# Patient Record
Sex: Female | Born: 1970 | Race: White | Hispanic: No | Marital: Married | Smoking: Never smoker
Health system: Southern US, Community
[De-identification: ages and names within clinical notes are randomized; demographics above are authoritative.]

## PROBLEM LIST (undated history)

## (undated) DIAGNOSIS — R51 Headache: Secondary | ICD-10-CM

## (undated) DIAGNOSIS — R55 Syncope and collapse: Secondary | ICD-10-CM

## (undated) DIAGNOSIS — N390 Urinary tract infection, site not specified: Secondary | ICD-10-CM

## (undated) DIAGNOSIS — T7840XA Allergy, unspecified, initial encounter: Secondary | ICD-10-CM

## (undated) HISTORY — DX: Headache: R51

## (undated) HISTORY — DX: Allergy, unspecified, initial encounter: T78.40XA

## (undated) HISTORY — DX: Urinary tract infection, site not specified: N39.0

## (undated) HISTORY — DX: Syncope and collapse: R55

---

## 2006-03-06 ENCOUNTER — Ambulatory Visit: Payer: Self-pay | Admitting: Gynecology

## 2006-04-06 ENCOUNTER — Ambulatory Visit: Payer: Self-pay | Admitting: Obstetrics & Gynecology

## 2007-03-22 ENCOUNTER — Observation Stay: Payer: Self-pay | Admitting: Unknown Physician Specialty

## 2007-03-23 ENCOUNTER — Ambulatory Visit: Payer: Self-pay | Admitting: Obstetrics & Gynecology

## 2007-03-29 ENCOUNTER — Observation Stay: Payer: Self-pay | Admitting: Unknown Physician Specialty

## 2007-04-09 ENCOUNTER — Ambulatory Visit: Payer: Self-pay | Admitting: Unknown Physician Specialty

## 2007-04-25 ENCOUNTER — Inpatient Hospital Stay: Payer: Self-pay

## 2008-09-29 ENCOUNTER — Ambulatory Visit: Payer: Self-pay

## 2012-09-24 ENCOUNTER — Ambulatory Visit: Payer: Self-pay | Admitting: Internal Medicine

## 2012-11-12 ENCOUNTER — Encounter: Payer: Self-pay | Admitting: Internal Medicine

## 2012-11-12 ENCOUNTER — Telehealth: Payer: Self-pay | Admitting: Internal Medicine

## 2012-11-12 ENCOUNTER — Other Ambulatory Visit (HOSPITAL_COMMUNITY)
Admission: RE | Admit: 2012-11-12 | Discharge: 2012-11-12 | Disposition: A | Discharge status: Home / Self Care | Payer: Managed Care, Other (non HMO) | Source: Ambulatory Visit | Attending: Internal Medicine | Admitting: Internal Medicine

## 2012-11-12 ENCOUNTER — Ambulatory Visit (INDEPENDENT_AMBULATORY_CARE_PROVIDER_SITE_OTHER): Payer: Managed Care, Other (non HMO) | Admitting: Internal Medicine

## 2012-11-12 VITALS — BP 120/78 | HR 80 | Temp 97.7°F | Resp 18 | Ht 63.0 in | Wt 142.2 lb

## 2012-11-12 DIAGNOSIS — Z1151 Encounter for screening for human papillomavirus (HPV): Secondary | ICD-10-CM | POA: Insufficient documentation

## 2012-11-12 DIAGNOSIS — Z124 Encounter for screening for malignant neoplasm of cervix: Secondary | ICD-10-CM

## 2012-11-12 DIAGNOSIS — N390 Urinary tract infection, site not specified: Secondary | ICD-10-CM

## 2012-11-12 DIAGNOSIS — Z1239 Encounter for other screening for malignant neoplasm of breast: Secondary | ICD-10-CM

## 2012-11-12 DIAGNOSIS — Z13220 Encounter for screening for lipoid disorders: Secondary | ICD-10-CM

## 2012-11-12 DIAGNOSIS — Z01419 Encounter for gynecological examination (general) (routine) without abnormal findings: Secondary | ICD-10-CM | POA: Insufficient documentation

## 2012-11-12 DIAGNOSIS — R51 Headache: Secondary | ICD-10-CM

## 2012-11-12 LAB — CBC WITH DIFFERENTIAL/PLATELET
Basophils Relative: 0.9 % (ref 0.0–3.0)
Eosinophils Absolute: 0.1 10*3/uL (ref 0.0–0.7)
Eosinophils Relative: 1.9 % (ref 0.0–5.0)
HCT: 36 % (ref 36.0–46.0)
Lymphocytes Relative: 33.5 % (ref 12.0–46.0)
Lymphs Abs: 1.8 10*3/uL (ref 0.7–4.0)
Monocytes Relative: 11.7 % (ref 3.0–12.0)
Neutro Abs: 2.7 10*3/uL (ref 1.4–7.7)
Neutrophils Relative %: 52 % (ref 43.0–77.0)
RBC: 4.46 Mil/uL (ref 3.87–5.11)
WBC: 5.3 10*3/uL (ref 4.5–10.5)

## 2012-11-12 LAB — COMPREHENSIVE METABOLIC PANEL
AST: 20 U/L (ref 0–37)
Albumin: 4 g/dL (ref 3.5–5.2)
BUN: 13 mg/dL (ref 6–23)
Calcium: 8.9 mg/dL (ref 8.4–10.5)
Chloride: 105 mEq/L (ref 96–112)
Glucose, Bld: 76 mg/dL (ref 70–99)
Potassium: 4 mEq/L (ref 3.5–5.1)

## 2012-11-12 LAB — LIPID PANEL
Cholesterol: 137 mg/dL (ref 0–200)
LDL Cholesterol: 82 mg/dL (ref 0–99)

## 2012-11-12 NOTE — Progress Notes (Signed)
  Subjective:    [name], female    DOB: [birth year], 42 y.o.   MRN: [id number]  HPI 42 year old female with past history of headaches who comes in today to establish care and for a complete physical exam.  She states that after her period, she experiences bad headaches.  Had no headaches during her pregnancy.  Headache may last 3-5 days.  Since she turned 41, her headaches appear to be lasting longer (7-10 days).  She is seeing OB.  Tried continuous birth control pills.   Made no change in headaches.  Off now.  Periods are regular.  Occur every 28 days.  Was evaluated at [facility].  Saw Dr Neale Burly.  Was started on topamax.  Took for one day and did not tolerate.  Has a history of reoccurring UTIs.  Has seen urology (Dr Lonna Cobb).  Had extensive w/up including cystoscopy and CT.  Was on daily suppressive abx for a while.  Not on abx now.     Past Medical History  Diagnosis Date  . Fainting spell   . Headache     Outpatient Encounter Prescriptions as of 11/12/2012  Medication Sig Dispense Refill  . Magnesium 400 MG CAPS Take 400 mg by mouth daily.      . Riboflavin (B-2 PO) Take 200 mg by mouth daily.       No facility-administered encounter medications on file as of 11/12/2012.    Review of Systems Patient denies any lightheadedness or dizziness.  Headaches as outlined.  No chest pain, tightness or palpitations.  No increased shortness of breath, cough or congestion.  No nausea or vomiting.  No acid reflux.  No abdominal pain or cramping.  No bowel change, such as diarrhea, constipation, BRBPR or melana.  No urine change now.  Has a history of reoccurring uti's.  Had extensive w/up.  No vaginal problems.  LMP 10/18/12.  Has been told her iron was low in the past.      Objective:   Physical Exam Filed Vitals:   11/12/12 0830  BP: 120/78  Pulse: 80  Temp: 97.7 F (36.5 C)  Resp: 61   42 year old female in no acute distress.   HEENT:  Nares- clear.   Oropharynx - without lesions. NECK:  Supple.  Nontender.  No audible bruit.  HEART:  Appears to be regular. LUNGS:  No crackles or wheezing audible.  Respirations even and unlabored.  RADIAL PULSE:  Equal bilaterally.    BREASTS:  No nipple discharge or nipple retraction present.  Could not appreciate any distinct nodules or axillary adenopathy.  ABDOMEN:  Soft, nontender.  Bowel sounds present and normal.  No audible abdominal bruit.  GU:  Normal external genitalia.  Vaginal vault without lesions.  Cervix identified.  Pap performed. Could not appreciate any adnexal masses or tenderness.   RECTAL:  Not performed.  EXTREMITIES:  No increased edema present.  DP pulses palpable and equal bilaterally.          Assessment & Plan:  POST PARTUM DEPRESSION.  Had post partum depression.  Doing well now.  Follow.   HISTORY OF LOW IRON.  Pt reports having a history of low iron.  Check cbc.   HEALTH MAINTENANCE.  Physical today.  Schedule mammogram.  Check routine labs including cholesterol.  Obtain outside records for review.

## 2012-11-12 NOTE — Telephone Encounter (Signed)
Pt giving info about medication:    Topamax   Week 1:  25 mg   Week 2:  50 mg   Week 3:  75 mg  at bedtime. This dose will probably be adjusted to 100 to 200 mg per day.

## 2012-11-12 NOTE — Telephone Encounter (Signed)
Pt notified of labs via my chart.  Will add iron studies.  Also addressed topamax phone message.

## 2012-11-12 NOTE — Telephone Encounter (Signed)
Please Advise... Patient was seen today 04/14

## 2012-11-13 ENCOUNTER — Ambulatory Visit: Payer: Managed Care, Other (non HMO)

## 2012-11-13 DIAGNOSIS — D649 Anemia, unspecified: Secondary | ICD-10-CM

## 2012-11-13 LAB — IBC PANEL
Saturation Ratios: 26.5 % (ref 20.0–50.0)
Transferrin: 231.8 mg/dL (ref 212.0–360.0)

## 2012-11-13 LAB — FERRITIN: Ferritin: 19.4 ng/mL (ref 10.0–291.0)

## 2012-11-13 NOTE — Telephone Encounter (Signed)
Pt to notify me - next step desires.

## 2012-11-14 ENCOUNTER — Telehealth: Payer: Self-pay | Admitting: Internal Medicine

## 2012-11-14 NOTE — Telephone Encounter (Signed)
Responded to My Chart message  - regarding topamax

## 2012-11-19 ENCOUNTER — Encounter: Payer: Self-pay | Admitting: Internal Medicine

## 2012-11-20 ENCOUNTER — Telehealth: Payer: Self-pay | Admitting: Internal Medicine

## 2012-11-20 ENCOUNTER — Encounter: Payer: Self-pay | Admitting: Internal Medicine

## 2012-11-20 DIAGNOSIS — D649 Anemia, unspecified: Secondary | ICD-10-CM

## 2012-11-20 NOTE — Telephone Encounter (Signed)
Pt notified of labs via my chart.  Was notified of need for a follow up cbc in one month.  Please schedule her for a non fasting lab appt in one month.

## 2012-11-21 ENCOUNTER — Telehealth: Payer: Self-pay

## 2012-11-21 NOTE — Telephone Encounter (Signed)
Notified patient that her pap smear results  is ok.

## 2012-11-22 ENCOUNTER — Telehealth: Payer: Self-pay

## 2012-11-22 NOTE — Telephone Encounter (Signed)
Please call pt to let her know why she needs another cbc in 1 month.

## 2012-11-22 NOTE — Telephone Encounter (Signed)
Left message on voicemail with explaination-labs CBC was abnormal & pt has a HX of anemia

## 2012-11-22 NOTE — Telephone Encounter (Signed)
Left message for patient to return call in regards to the mychart message Dr. Lorin Picket sent patient  I reviewed your recent lab results. Your iron stores are ok. Given that your hemoglobin was just slightly decreased, I would like to recheck a blood count in one month - just to confirm remaining stable. I will have someone from the office call you with a lab appointment.

## 2012-11-22 NOTE — Telephone Encounter (Signed)
Left message for pt to call office

## 2012-11-30 ENCOUNTER — Encounter: Payer: Self-pay | Admitting: Internal Medicine

## 2012-11-30 ENCOUNTER — Telehealth: Payer: Self-pay | Admitting: Internal Medicine

## 2012-11-30 DIAGNOSIS — R519 Headache, unspecified: Secondary | ICD-10-CM | POA: Insufficient documentation

## 2012-11-30 NOTE — Assessment & Plan Note (Signed)
Persistent worsening headaches.  Saw Dr Neale Burly at the [facility].  Had recommend her start on Topamax.  She took one pill and then stopped secondary to intolerance.  She wants to try the Topamax again.  Has medication at home.  Will start 25mg  q day for two weeks and then increase to 50mg  q day.  Follow closely.

## 2012-11-30 NOTE — Telephone Encounter (Signed)
I am not sure that the Topamax is causing these symptoms.  She can hold (since only on for a short time) and see if symptoms resolve.  If persistent sx, will need reeval

## 2012-11-30 NOTE — Telephone Encounter (Signed)
Patient Information:  Caller Name: Avilene  Phone: [phone]  Patient: Cassandra Matthews, Cassandra Matthews  Gender: Female  DOB: 12/28/1970  Age: 42 Years  PCP: [facility]  Pregnant: No  Office Follow Up:  Does the office need to follow up with this patient?: Yes  Instructions For The Office: Please review.  Asking if should change the Topamax, could Sx be side effects of Rx? Can reach patient at [phone] until 3 pm then cell  [phone] rest of day   Symptoms  Reason For Call & Symptoms: Has been having headaches during her periods.  Started Topamax 25 mg on 4/14.  Increased dose to 50 mg on 4/22, then LMP 4/25 without headache.  Started  low abdominal discomfort on Mon 4/28 and Tues 4/29, discomfort at waist or lower.  Thurs 5/1 some slight dizziness, loss of energy when standing up with mild intermittent low abdominal discomfort.  Having loose stools in the mornings, more discomfort in the afternoons.  Unsure if related to Topamax.  Reviewed Health History In EMR: Yes  Reviewed Medications In EMR: Yes  Reviewed Allergies In EMR: Yes  Reviewed Surgeries / Procedures: Yes  Date of Onset of Symptoms: 11/26/2012 OB / GYN:  LMP: 11/23/2012  Guideline(s) Used:  Diarrhea  Disposition Per Guideline:   Home Care  Reason For Disposition Reached:   Mild diarrhea  Advice Given:  Fluids:  Drink more fluids, at least 8-10 glasses (8 oz or 240 ml) daily.  For example: sports drinks, diluted fruit juices, soft drinks.  Supplement this with saltine crackers or soups to make certain that you are getting sufficient fluid and salt to meet your body's needs.  Avoid caffeinated beverages (Reason: caffeine is mildly dehydrating).  Nutrition:  Ideal initial foods include boiled starches/cereals (e.g., potatoes, rice, noodles, wheat, oats) with a small amount of salt to taste.  Other acceptable foods include: bananas, yogurt, crackers, soup.  Call Back If:  Signs of dehydration occur (e.g., no urine for  more than 12 hours, very dry mouth, lightheaded, etc.)  Diarrhea lasts over 7 days  You become worse.  Patient Will Follow Care Advice:  YES

## 2012-11-30 NOTE — Telephone Encounter (Signed)
Pt informed of recommendations

## 2012-12-02 ENCOUNTER — Encounter: Payer: Self-pay | Admitting: Internal Medicine

## 2012-12-02 DIAGNOSIS — N390 Urinary tract infection, site not specified: Secondary | ICD-10-CM | POA: Insufficient documentation

## 2012-12-02 NOTE — Assessment & Plan Note (Signed)
Has had extensive w/up previously.  Saw Dr Lonna Cobb.  Had CT and cystoscopy.  Has previously been on daily suppressive abx.  Off now.  Doing well currently.  Follow.

## 2012-12-07 ENCOUNTER — Telehealth: Payer: Self-pay | Admitting: *Deleted

## 2012-12-07 DIAGNOSIS — D649 Anemia, unspecified: Secondary | ICD-10-CM

## 2012-12-07 NOTE — Telephone Encounter (Signed)
Pt is coming in for labs Monday 05.12.2014 what labs and dx would you like?  Thank you   

## 2012-12-08 NOTE — Telephone Encounter (Signed)
Placed order for cbc and b12

## 2012-12-10 ENCOUNTER — Other Ambulatory Visit (INDEPENDENT_AMBULATORY_CARE_PROVIDER_SITE_OTHER): Payer: Managed Care, Other (non HMO)

## 2012-12-10 DIAGNOSIS — D649 Anemia, unspecified: Secondary | ICD-10-CM

## 2012-12-10 LAB — CBC WITH DIFFERENTIAL/PLATELET
Basophils Absolute: 0.1 10*3/uL (ref 0.0–0.1)
HCT: 34.1 % — ABNORMAL LOW (ref 36.0–46.0)
Hemoglobin: 11.5 g/dL — ABNORMAL LOW (ref 12.0–15.0)
Lymphs Abs: 2.6 10*3/uL (ref 0.7–4.0)
MCHC: 33.7 g/dL (ref 30.0–36.0)
MCV: 80 fl (ref 78.0–100.0)
Monocytes Absolute: 0.7 10*3/uL (ref 0.1–1.0)
Monocytes Relative: 9.5 % (ref 3.0–12.0)
Neutro Abs: 3.6 10*3/uL (ref 1.4–7.7)
Platelets: 261 10*3/uL (ref 150.0–400.0)
RDW: 14.2 % (ref 11.5–14.6)

## 2012-12-12 ENCOUNTER — Encounter: Payer: Self-pay | Admitting: Internal Medicine

## 2012-12-12 ENCOUNTER — Telehealth: Payer: Self-pay | Admitting: Internal Medicine

## 2012-12-12 NOTE — Telephone Encounter (Signed)
Needs a follow up appt with me within the next month ( ).  Thanks.

## 2012-12-14 NOTE — Telephone Encounter (Signed)
Appointment 6/30 °Pt aware °

## 2012-12-17 ENCOUNTER — Ambulatory Visit: Payer: Self-pay | Admitting: Internal Medicine

## 2012-12-22 ENCOUNTER — Encounter: Payer: Self-pay | Admitting: Internal Medicine

## 2013-01-08 ENCOUNTER — Ambulatory Visit: Payer: Managed Care, Other (non HMO) | Admitting: Internal Medicine

## 2013-01-28 ENCOUNTER — Encounter: Payer: Self-pay | Admitting: Internal Medicine

## 2013-01-28 ENCOUNTER — Ambulatory Visit (INDEPENDENT_AMBULATORY_CARE_PROVIDER_SITE_OTHER): Payer: Managed Care, Other (non HMO) | Admitting: Internal Medicine

## 2013-01-28 VITALS — BP 110/70 | HR 56 | Temp 98.6°F | Ht 63.0 in | Wt 148.5 lb

## 2013-01-28 DIAGNOSIS — N390 Urinary tract infection, site not specified: Secondary | ICD-10-CM

## 2013-01-28 DIAGNOSIS — R51 Headache: Secondary | ICD-10-CM

## 2013-01-28 MED ORDER — TOPIRAMATE 25 MG PO TABS: ORAL_TABLET | ORAL | Status: DC

## 2013-01-28 NOTE — Progress Notes (Signed)
Subjective:    [name], female    DOB: Jan 10, 1971, 42 y.o.   MRN: [id number]  HPI 42 year old female with past history of headaches who comes in today for a scheduled follow up.  She had been experiencing an increased number of headaches.  See last note for details.   Periods are regular.  Occur every 28 days.  Seem to be worse around her cycle.  Was evaluated at [facility].  Saw Dr Neale Burly.  Was started on topamax.  Took for one day and did not tolerate. Last visit, she was started back on Topamax.  She is tolerating the 50mg  dose now.  Headaches are better.  Not occurring as frequent and are not as intense.  Would like to try to increase the dose, but is afraid of intolerance at a higher dose.   Has a history of reoccurring UTIs.  Has seen urology (Dr Lonna Cobb).  Had extensive w/up including cystoscopy and CT.  Was on daily suppressive abx for a while. Had been off, but has now restarted daily nitrofurantoin.  Symptoms are better now.  Is planning to f/u with Dr Lonna Cobb soon.    Past Medical History  Diagnosis Date  . Fainting spell   . Headache(784.0)   . Allergy   . Recurrent urinary tract infection     Outpatient Encounter Prescriptions as of 01/28/2013  Medication Sig Dispense Refill  . Magnesium 400 MG CAPS Take 400 mg by mouth daily.      . nitrofurantoin (MACRODANTIN) 50 MG capsule Take 50 mg by mouth daily.      . Riboflavin (B-2 PO) Take 200 mg by mouth daily.      Marland Kitchen topiramate (TOPAMAX) 25 MG tablet 25 mg in the am and 50mg  q hs  90 tablet  3  . [DISCONTINUED] topiramate (TOPAMAX) 25 MG tablet Take 75 mg by mouth at bedtime.       No facility-administered encounter medications on file as of 01/28/2013.    Review of Systems Patient denies any lightheadedness or dizziness.  Headaches as outlined.  Better on Topamax.  No chest pain, tightness or palpitations.  No increased shortness of breath, cough or congestion.  No nausea or vomiting.  No acid  reflux.  No abdominal pain or cramping.  No bowel change, such as diarrhea, constipation, BRBPR or melana.  No urine change now.  Has a history of reoccurring uti's.  Had extensive w/up.  No vaginal problems.  LMP end of last month.  Husband has had a vasectomy.      Objective:   Physical Exam  Filed Vitals:   01/28/13 1602  BP: 110/70  Pulse: 56  Temp: 98.6 F (56 C)   42 year old female in no acute distress.   HEENT:  Nares- clear.  Oropharynx - without lesions. NECK:  Supple.  Nontender.  No audible bruit.  HEART:  Appears to be regular. LUNGS:  No crackles or wheezing audible.  Respirations even and unlabored.  RADIAL PULSE:  Equal bilaterally.  ABDOMEN:  Soft, nontender.  Bowel sounds present and normal.  No audible abdominal bruit.  EXTREMITIES:  No increased edema present.  DP pulses palpable and equal bilaterally.          Assessment & Plan:  POST PARTUM DEPRESSION.  Had post partum depression.  Doing well now.  Follow.   HISTORY OF LOW IRON.  Pt reports having a history of low iron.  Last hgb 12/17/12 -  11.5.  Stable. Follow.    HEALTH MAINTENANCE.  Physical 11/12/12.  Mammogram 12/17/12.

## 2013-01-28 NOTE — Assessment & Plan Note (Signed)
Has had extensive w/up previously.  Saw Dr Lonna Cobb.  Had CT and cystoscopy.  Has previously been on daily suppressive abx.  Was off last visit.  Back on now.  Planning to follow up with Dr Lonna Cobb soon.

## 2013-01-28 NOTE — Assessment & Plan Note (Signed)
Persistent worsening headaches.  Saw Dr Neale Burly at the [facility].  Had recommend her start on Topamax.  She started Topamax after our last visit.  Tolerating 50mg  of Topamax now.  Headaches better.  Will increase to 25mg  in the am and 50mg  q hs.  Follow.

## 2013-02-13 ENCOUNTER — Encounter: Payer: Self-pay | Admitting: Internal Medicine

## 2013-02-20 ENCOUNTER — Encounter: Payer: Self-pay | Admitting: Internal Medicine

## 2013-02-20 ENCOUNTER — Ambulatory Visit (INDEPENDENT_AMBULATORY_CARE_PROVIDER_SITE_OTHER): Payer: Managed Care, Other (non HMO) | Admitting: Internal Medicine

## 2013-02-20 VITALS — BP 110/80 | HR 93 | Temp 98.4°F | Ht 63.0 in | Wt 141.0 lb

## 2013-02-20 DIAGNOSIS — N390 Urinary tract infection, site not specified: Secondary | ICD-10-CM

## 2013-02-20 LAB — POCT URINALYSIS DIPSTICK
Bilirubin, UA: NEGATIVE
Ketones, UA: NEGATIVE
Protein, UA: NEGATIVE
Spec Grav, UA: 1.025
pH, UA: 5.5

## 2013-02-20 MED ORDER — CIPROFLOXACIN HCL 500 MG PO TABS: 500.0000 mg | ORAL_TABLET | Freq: Two times a day (BID) | ORAL | Status: DC

## 2013-02-20 NOTE — Progress Notes (Signed)
  Subjective:    [name], female    DOB: [birth year], 42 y.o.   MRN: [id number]  Urinary Tract Infection  Associated symptoms include frequency.  Back Pain  Urinary Frequency  Associated symptoms include frequency.  42 year old female with past history of headaches who comes in today as a work in with concerns regarding some low back pain, increased urinary frequency and nocturia.   Has a history of reoccurring UTIs.  Has seen urology (Dr Lonna Cobb).  Had extensive w/up including cystoscopy and CT.  Was on daily suppressive abx for a while. Had been off, but has now restarted daily nitrofurantoin.  She started having low back pain on 02/16/13.  Notices more soreness in the am.  Some minimal nocturia. Took some ibuprofen last night and it eased off some. She noticed about a week ago, some increased frequency and took three days of cipro.  Symptoms improved for a while and now have returned.  No hematuria.  No vaginal itching or discharge.  No abdominal pain or cramping.  No nausea or vomiting.  No bowel change.   Is planning to f/u with Dr Lonna Cobb soon.  Due to start her menstrual period 02/25/13.     Past Medical History  Diagnosis Date  . Fainting spell   . Headache(784.0)   . Allergy   . Recurrent urinary tract infection     Outpatient Encounter Prescriptions as of 02/20/2013  Medication Sig Dispense Refill  . Magnesium 400 MG CAPS Take 400 mg by mouth daily.      . nitrofurantoin (MACRODANTIN) 50 MG capsule Take 50 mg by mouth daily.      . Riboflavin (B-2 PO) Take 200 mg by mouth daily.      Marland Kitchen topiramate (TOPAMAX) 25 MG tablet 25 mg in the am and 50mg  q hs  90 tablet  3   No facility-administered encounter medications on file as of 02/20/2013.    Review of Systems  Genitourinary: Positive for frequency.  Musculoskeletal: Positive for back pain.  No nausea or vomiting.  No abdominal pain or cramping.  No bowel change, such as diarrhea, constipation, BRBPR or melana.  Has  a history of reoccurring uti's.  Had extensive w/up as outlined.  Increased urinary frequency as outlined.  Some persistent back pain.  Some nocturia.  No vaginal problems.  LMP due to start 02/25/13.   Husband has had a vasectomy.      Objective:   Physical Exam  Filed Vitals:   02/20/13 0759  BP: 110/80  Pulse: 93  Temp: 98.4 F (58.33 C)   42 year old female in no acute distress.  HEART:  Appears to be regular. LUNGS:  No crackles or wheezing audible.  Respirations even and unlabored. ABDOMEN:  Soft, nontender.  Bowel sounds present and normal.  No audible abdominal bruit.     BACK:  Non tender to palpation.  No CVA tenderness.      Assessment & Plan:  BACK PAIN.  Symptoms and exam as outlined.  Urine dip revealed small leukocytes with trace blood.  Will send for culture.  Treat for uti - Cipro 500mg  bid x 1 week.  Keep follow up appt with Dr Lonna Cobb.  Ibuprofen prn as directed.  Follow.  Notify me if symptoms change, worsen or do not resolve.    HEALTH MAINTENANCE.  Physical 11/12/12.  Mammogram 12/17/12.

## 2013-02-24 ENCOUNTER — Encounter: Payer: Self-pay | Admitting: Internal Medicine

## 2013-02-24 NOTE — Assessment & Plan Note (Signed)
Has had extensive w/up previously.  Saw Dr Lonna Cobb.  Had CT and cystoscopy.  Back on suppressive antibiotics.  Symptoms as outlined.  Treat with cipro as directed.  Planning to follow up with Dr Lonna Cobb soon.  Hopefully back pain will improve with cipro treatment.  Ibuprofen as directed.  Follow.

## 2013-02-27 ENCOUNTER — Encounter: Payer: Self-pay | Admitting: Internal Medicine

## 2013-05-20 ENCOUNTER — Ambulatory Visit (INDEPENDENT_AMBULATORY_CARE_PROVIDER_SITE_OTHER): Payer: Managed Care, Other (non HMO)

## 2013-05-20 DIAGNOSIS — Z23 Encounter for immunization: Secondary | ICD-10-CM

## 2013-05-30 ENCOUNTER — Other Ambulatory Visit: Payer: Self-pay | Admitting: Internal Medicine

## 2013-06-03 ENCOUNTER — Ambulatory Visit (INDEPENDENT_AMBULATORY_CARE_PROVIDER_SITE_OTHER): Payer: Managed Care, Other (non HMO) | Admitting: Internal Medicine

## 2013-06-03 ENCOUNTER — Encounter: Payer: Self-pay | Admitting: Internal Medicine

## 2013-06-03 VITALS — BP 110/70 | HR 61 | Temp 98.4°F | Ht 63.0 in | Wt 147.2 lb

## 2013-06-03 DIAGNOSIS — D649 Anemia, unspecified: Secondary | ICD-10-CM

## 2013-06-03 DIAGNOSIS — R51 Headache: Secondary | ICD-10-CM

## 2013-06-03 DIAGNOSIS — N390 Urinary tract infection, site not specified: Secondary | ICD-10-CM

## 2013-06-03 LAB — CBC WITH DIFFERENTIAL/PLATELET
Eosinophils Relative: 0.5 % (ref 0.0–5.0)
HCT: 35.2 % — ABNORMAL LOW (ref 36.0–46.0)
Hemoglobin: 11.6 g/dL — ABNORMAL LOW (ref 12.0–15.0)
Lymphs Abs: 1.8 10*3/uL (ref 0.7–4.0)
MCV: 81.1 fl (ref 78.0–100.0)
Monocytes Absolute: 0.7 10*3/uL (ref 0.1–1.0)
Monocytes Relative: 8.1 % (ref 3.0–12.0)
Neutro Abs: 5.8 10*3/uL (ref 1.4–7.7)
Platelets: 237 10*3/uL (ref 150.0–400.0)
WBC: 8.4 10*3/uL (ref 4.5–10.5)

## 2013-06-03 LAB — FERRITIN: Ferritin: 24.7 ng/mL (ref 10.0–291.0)

## 2013-06-03 MED ORDER — TOPIRAMATE 50 MG PO TABS: 50.0000 mg | ORAL_TABLET | Freq: Two times a day (BID) | ORAL | Status: DC

## 2013-06-03 NOTE — Progress Notes (Signed)
  Subjective:    [name], female    DOB: [birth year], 42 y.o.   MRN: [id number]  HPI 42 year old female with past history of headaches who comes in today for a scheduled follow up.  She had been experiencing an increased number of headaches.  See previous notes for details.   Periods are regular.  Occur every 28 days.  Headaches occur around her cycle.  Was evaluated at [facility].  Saw Dr Neale Burly.  Was started on topamax.  Took for one day and did not tolerate.  We started her back on Topamax.  She is tolerating.  On 50mg  bid now.  Headaches are better.  Not as intense.   Has a history of reoccurring UTIs.  Has seen urology (Dr Lonna Cobb). Had extensive w/up including cystoscopy and CT.  Is on daily suppressive abx.  Has been doing well with this.  Has f/u next week.    Past Medical History  Diagnosis Date  . Fainting spell   . Headache(784.0)   . Allergy   . Recurrent urinary tract infection     Outpatient Encounter Prescriptions as of 06/03/2013  Medication Sig  . nitrofurantoin (MACRODANTIN) 50 MG capsule Take 50 mg by mouth daily.  Marland Kitchen topiramate (TOPAMAX) 25 MG tablet TAKE 2 TABLET BY MOUTH EVERY MORNING AND 2 TABLETS AT BEDTIME  . [DISCONTINUED] topiramate (TOPAMAX) 25 MG tablet TAKE 1 TABLET BY MOUTH EVERY MORNING AND 2 TABLETS AT BEDTIME  . [DISCONTINUED] ciprofloxacin (CIPRO) 500 MG tablet Take 1 tablet (500 mg total) by mouth 2 (two) times daily.  . [DISCONTINUED] Magnesium 400 MG CAPS Take 400 mg by mouth daily.  . [DISCONTINUED] Riboflavin (B-2 PO) Take 200 mg by mouth daily.    Review of Systems Patient denies any lightheadedness or dizziness.  Headaches as outlined.  Better on Topamax.  No chest pain, tightness or palpitations.  No increased shortness of breath, cough or congestion.  No nausea or vomiting.  No acid reflux.  No abdominal pain or cramping.  No bowel change, such as diarrhea, constipation, BRBPR or melana.  No urine change now.  Has  a history of reoccurring uti's.  Had extensive w/up.  No vaginal problems.  Husband has had a vasectomy.  Urinary symptoms have been better on suppressive abx.      Objective:   Physical Exam  Filed Vitals:   06/03/13 1017  BP: 110/70  Pulse: 61  Temp: 98.4 F (68.5 C)   42 year old female in no acute distress.   HEENT:  Nares- clear.  Oropharynx - without lesions. NECK:  Supple.  Nontender.  No audible bruit.  HEART:  Appears to be regular. LUNGS:  No crackles or wheezing audible.  Respirations even and unlabored.  RADIAL PULSE:  Equal bilaterally.  ABDOMEN:  Soft, nontender.  Bowel sounds present and normal.  No audible abdominal bruit.  EXTREMITIES:  No increased edema present.  DP pulses palpable and equal bilaterally.          Assessment & Plan:  POST PARTUM DEPRESSION.  Had post partum depression.  Doing well now.  Follow.   HISTORY OF LOW IRON.  Pt reports having a history of low iron.  Last hgb -  Stable. Follow.    HEALTH MAINTENANCE.  Physical 11/12/12.  Mammogram 12/17/12.

## 2013-06-03 NOTE — Assessment & Plan Note (Addendum)
Saw Dr Neale Burly at the [facility].  Had recommend her start on Topamax.  She is now taking 50mg  bid.  Tolerating.  Headaches better.  Follow.

## 2013-06-04 ENCOUNTER — Encounter: Payer: Self-pay | Admitting: Internal Medicine

## 2013-06-04 NOTE — Assessment & Plan Note (Signed)
Has had extensive w/up previously.  Saw Dr Lonna Cobb.  Had CT and cystoscopy.  Back on suppressive antibiotics. Doing well.  Planning for f/u with Dr Lonna Cobb next week.

## 2013-06-06 NOTE — Telephone Encounter (Signed)
Mailed unread message to pt  

## 2013-10-28 ENCOUNTER — Telehealth: Payer: Self-pay | Admitting: Internal Medicine

## 2013-10-28 DIAGNOSIS — Z1239 Encounter for other screening for malignant neoplasm of breast: Secondary | ICD-10-CM

## 2013-10-28 NOTE — Telephone Encounter (Signed)
I have placed the order for the mammogram.  She had her last mammogram 12/17/12.  Will have to be after 12/17/13.  Regarding the topamax, if she is having increased headaches on Topamax 100mg  then I would like to have neurology reevaluate to confirm this is correct treatment for her.  If agreeable, let me know and I will place the order for the referral.  Amber should be contacting her about the mammogram.  Thanks.

## 2013-10-28 NOTE — Telephone Encounter (Signed)
Left message, notifying of mammogram referral and referral for neurologist. Requested call back to discuss.

## 2013-10-28 NOTE — Telephone Encounter (Signed)
Pt called back and left VM, states [facility], was told by specialist she may need to increase her dose after awhile. States she has been on 100 mg for one year. Has not had an increase in headaches, just an increase in intensity, states they are hormonal headaches and would not like to be referred to a neurologist

## 2013-10-28 NOTE — Telephone Encounter (Signed)
Pt called to see if Topamax dose could be changed - wants to change from 100 mg to 150 mg.  States we would have to call in and change the prescription [facility].    Pt CPE scheduled 5/18, schedule says provider will be out of office, needs to rs.  Pt will be pushed to 6/1.  Pt concerned that her CPE will not stay on track and will push her mammogram out.  Pt would like to see if she can be seen closer to 4/14 for her CPE and if not, if mammogram can go ahead and be ordered.  Pt states mammogram should be in [place] for 3D per her last discussion with Dr. Lorin PicketScott.  Please advise.

## 2013-10-29 NOTE — Telephone Encounter (Signed)
Pt called back today & stated that she never received a response from yesterday about the Topamax-please advise

## 2013-10-29 NOTE — Telephone Encounter (Signed)
I sent pt a my chart message regarding the migraine treatment.  If she calls back, tell her I am going to contact neurology to discuss her treatment - if she is in agreement.  Let me know if she calls back.  Thanks.

## 2013-10-30 ENCOUNTER — Other Ambulatory Visit: Payer: Self-pay | Admitting: *Deleted

## 2013-10-30 MED ORDER — TOPIRAMATE 50 MG PO TABS: 75.0000 mg | ORAL_TABLET | Freq: Two times a day (BID) | ORAL | Status: DC

## 2013-10-30 NOTE — Telephone Encounter (Signed)
She should be on topamax 50mg  bid now.  If so, notify her that I am ok to increase the topamax to 75mg  bid and see how she does on this medication.  Tell her to let us know if she has any problems.

## 2013-10-30 NOTE — Telephone Encounter (Signed)
Pt called back today & was notified of mychart message. She said that she has tried Magnesium before & she is okay for you to discuss her treatment with Neurology but she does not want to go see a Neurologist since she already knows that her headaches are hormonal. She also mentioned that she only has a 30 day supply left & will run out before her next appointment since her appt had to be rescheduled by us. Otherwise, she had planned on discussing this at her next appt. Pt states that she will wait to hear back from us.

## 2013-10-30 NOTE — Telephone Encounter (Signed)
Pt notified & [facility] Rx [facility]

## 2013-11-06 ENCOUNTER — Encounter: Payer: Self-pay | Admitting: Emergency Medicine

## 2013-12-03 ENCOUNTER — Other Ambulatory Visit: Payer: Self-pay | Admitting: Internal Medicine

## 2013-12-03 ENCOUNTER — Telehealth: Payer: Self-pay | Admitting: Internal Medicine

## 2013-12-03 DIAGNOSIS — Z1239 Encounter for other screening for malignant neoplasm of breast: Secondary | ICD-10-CM

## 2013-12-03 NOTE — Telephone Encounter (Signed)
Pt calling to see if Dr. Lorin PicketScott can set up her mammogram.  States this is due in May.  States Dr. Lorin PicketScott recommended 3D mammogram in [place].  Pt would like to have that done this time.  Mondays only, in a.m. If possible, but as long as it is a Monday.

## 2013-12-04 ENCOUNTER — Encounter: Payer: Self-pay | Admitting: *Deleted

## 2013-12-04 NOTE — Telephone Encounter (Signed)
Order placed for mammogram.

## 2013-12-05 ENCOUNTER — Encounter: Payer: Self-pay | Admitting: *Deleted

## 2013-12-16 ENCOUNTER — Encounter: Payer: Managed Care, Other (non HMO) | Admitting: Internal Medicine

## 2013-12-16 ENCOUNTER — Ambulatory Visit: Payer: Managed Care, Other (non HMO)

## 2013-12-30 ENCOUNTER — Encounter: Payer: Self-pay | Admitting: Internal Medicine

## 2013-12-30 ENCOUNTER — Ambulatory Visit (INDEPENDENT_AMBULATORY_CARE_PROVIDER_SITE_OTHER): Payer: Managed Care, Other (non HMO) | Admitting: Internal Medicine

## 2013-12-30 VITALS — BP 110/70 | HR 64 | Temp 98.3°F | Ht 62.8 in | Wt 139.0 lb

## 2013-12-30 DIAGNOSIS — R51 Headache: Secondary | ICD-10-CM

## 2013-12-30 DIAGNOSIS — Z13220 Encounter for screening for lipoid disorders: Secondary | ICD-10-CM

## 2013-12-30 DIAGNOSIS — Z139 Encounter for screening, unspecified: Secondary | ICD-10-CM

## 2013-12-30 DIAGNOSIS — N390 Urinary tract infection, site not specified: Secondary | ICD-10-CM

## 2013-12-30 LAB — POCT URINALYSIS DIPSTICK
Bilirubin, UA: NEGATIVE
GLUCOSE UA: NEGATIVE
KETONES UA: NEGATIVE
LEUKOCYTES UA: NEGATIVE
NITRITE UA: NEGATIVE
PH UA: 6
Protein, UA: NEGATIVE
RBC UA: NEGATIVE
Spec Grav, UA: 1.025
Urobilinogen, UA: 0.2

## 2013-12-30 LAB — CBC WITH DIFFERENTIAL/PLATELET
BASOS PCT: 0.6 % (ref 0.0–3.0)
Basophils Absolute: 0 10*3/uL (ref 0.0–0.1)
Eosinophils Absolute: 0.1 10*3/uL (ref 0.0–0.7)
Eosinophils Relative: 0.7 % (ref 0.0–5.0)
HEMATOCRIT: 38 % (ref 36.0–46.0)
Hemoglobin: 12.4 g/dL (ref 12.0–15.0)
LYMPHS ABS: 2.7 10*3/uL (ref 0.7–4.0)
LYMPHS PCT: 34.2 % (ref 12.0–46.0)
MCHC: 32.5 g/dL (ref 30.0–36.0)
MCV: 81.7 fl (ref 78.0–100.0)
MONOS PCT: 8.1 % (ref 3.0–12.0)
Monocytes Absolute: 0.6 10*3/uL (ref 0.1–1.0)
NEUTROS ABS: 4.4 10*3/uL (ref 1.4–7.7)
Neutrophils Relative %: 56.4 % (ref 43.0–77.0)
Platelets: 267 10*3/uL (ref 150.0–400.0)
RBC: 4.65 Mil/uL (ref 3.87–5.11)
RDW: 14.2 % (ref 11.5–15.5)
WBC: 7.9 10*3/uL (ref 4.0–10.5)

## 2013-12-30 LAB — LIPID PANEL
CHOL/HDL RATIO: 3
Cholesterol: 132 mg/dL (ref 0–200)
HDL: 46.6 mg/dL (ref >39.00)
LDL CALC: 78 mg/dL (ref 0–99)
TRIGLYCERIDES: 35 mg/dL (ref 0.0–149.0)
VLDL: 7 mg/dL (ref 0.0–40.0)

## 2013-12-30 LAB — COMPREHENSIVE METABOLIC PANEL
ALT: 12 U/L (ref 0–35)
AST: 14 U/L (ref 0–37)
Albumin: 4 g/dL (ref 3.5–5.2)
Alkaline Phosphatase: 42 U/L (ref 39–117)
BILIRUBIN TOTAL: 0.8 mg/dL (ref 0.2–1.2)
BUN: 13 mg/dL (ref 6–23)
CALCIUM: 8.8 mg/dL (ref 8.4–10.5)
CHLORIDE: 109 meq/L (ref 96–112)
CO2: 22 meq/L (ref 19–32)
CREATININE: 0.6 mg/dL (ref 0.4–1.2)
GFR: 107.98 mL/min (ref >60.00)
GLUCOSE: 67 mg/dL — AB (ref 70–99)
Potassium: 4 mEq/L (ref 3.5–5.1)
Sodium: 138 mEq/L (ref 135–145)
Total Protein: 6.8 g/dL (ref 6.0–8.3)

## 2013-12-30 LAB — TSH: TSH: 1.86 u[IU]/mL (ref 0.35–4.50)

## 2013-12-30 NOTE — Assessment & Plan Note (Signed)
Saw Dr Neale Burly at the [facility].  Had recommend her start on Topamax.  She is now on topamax 75mg  bid.  Doing better.  Follow.

## 2013-12-30 NOTE — Assessment & Plan Note (Signed)
Has had extensive w/up previously.  Saw Dr Lonna Cobb.  Had CT and cystoscopy.  Back on suppressive antibiotics.  Just had flare.  Treated with cipro.  Urine dip negative today.  Currently asymptomatic.  Follow.

## 2013-12-30 NOTE — Progress Notes (Signed)
Pre visit review using our clinic review tool, if applicable. No additional management support is needed unless otherwise documented below in the visit note. 

## 2013-12-30 NOTE — Progress Notes (Signed)
Subjective:    [name], female    DOB: 02-09-1971, 43 y.o.   MRN: [id number]  HPI 43 year old female with past history of headaches who comes in today for her physical exam.  She had been experiencing an increased number of headaches.  See previous notes for details.  Saw neurology (Dr Neale Burly).   On topamax 75mg  bid now.  Headaches better.  Not a significant issue for her now.   Periods are regular.  Occur every 28 days. Has a history of reoccurring UTIs.  Has seen urology (Dr Lonna Cobb).  Had extensive w/up including cystoscopy and CT.  Is on daily suppressive abx.  Has been doing well with this.  Had a flare last week.  Went to urgent care.  Took abx.  Symptoms resolved now.   Overall she feels she is doing better.     Past Medical History  Diagnosis Date  . Fainting spell   . Headache(784.0)   . Allergy   . Recurrent urinary tract infection     Outpatient Encounter Prescriptions as of 12/30/2013  Medication Sig  . Ascorbic Acid (VITAMIN C PO) Take by mouth daily.  Marland Kitchen lactobacillus acidophilus (BACID) TABS tablet Take 2 tablets by mouth 3 (three) times daily.  Marland Kitchen MAGNESIUM PO Take by mouth daily.  . nitrofurantoin (MACRODANTIN) 50 MG capsule Take 50 mg by mouth daily.  . Probiotic Product (PROBIOTIC DAILY PO) Take by mouth daily.  Marland Kitchen topiramate (TOPAMAX) 50 MG tablet Take 1.5 tablets (75 mg total) by mouth 2 (two) times daily.    Review of Systems Patient denies any lightheadedness or dizziness.  Headaches better on Topamax.  No chest pain, tightness or palpitations.  No increased shortness of breath, cough or congestion.  No nausea or vomiting.  No acid reflux.  No abdominal pain or cramping.  No bowel change, such as diarrhea, constipation, BRBPR or melana.  No urine change now.  Has a history of reoccurring uti's.  Had extensive w/up.  No vaginal problems.  Husband has had a vasectomy.  Urinary symptoms have been better on suppressive abx.      Objective:   Physical  Exam  Filed Vitals:   12/30/13 1117  BP: 110/70  Pulse: 64  Temp: 98.3 F (45.66 C)   43 year old female in no acute distress.   HEENT:  Nares- clear.  Oropharynx - without lesions. NECK:  Supple.  Nontender.  No audible bruit.  HEART:  Appears to be regular. LUNGS:  No crackles or wheezing audible.  Respirations even and unlabored.  RADIAL PULSE:  Equal bilaterally.    BREASTS:  No nipple discharge or nipple retraction present.  Could not appreciate any distinct nodules or axillary adenopathy.  ABDOMEN:  Soft, nontender.  Bowel sounds present and normal.  No audible abdominal bruit.  GU:  Not performed.    EXTREMITIES:  No increased edema present.  DP pulses palpable and equal bilaterally.      Assessment & Plan:  POST PARTUM DEPRESSION.  Had post partum depression.  Doing well now.  Follow.   HISTORY OF LOW IRON.  Pt reports having a history of low iron.  Last hgb -  Stable. Follow.    HEALTH MAINTENANCE.  Physical today.  Mammogram 12/17/12.  Is scheduled for a 3D mammogram next week.    I spent 25 minutes with the patient and more than 50% of the time was spent in consultation regarding the above.

## 2013-12-31 ENCOUNTER — Encounter: Payer: Self-pay | Admitting: Internal Medicine

## 2014-01-13 ENCOUNTER — Ambulatory Visit
Admission: RE | Admit: 2014-01-13 | Discharge: 2014-01-13 | Disposition: A | Discharge status: Home / Self Care | Payer: Managed Care, Other (non HMO) | Source: Ambulatory Visit | Attending: Internal Medicine | Admitting: Internal Medicine

## 2014-01-13 DIAGNOSIS — Z1239 Encounter for other screening for malignant neoplasm of breast: Secondary | ICD-10-CM

## 2014-01-14 ENCOUNTER — Encounter: Payer: Self-pay | Admitting: Internal Medicine

## 2014-01-15 ENCOUNTER — Other Ambulatory Visit: Payer: Self-pay | Admitting: Internal Medicine

## 2014-01-16 NOTE — Telephone Encounter (Signed)
Mailed unread message to pt  

## 2014-01-19 ENCOUNTER — Other Ambulatory Visit: Payer: Self-pay | Admitting: Internal Medicine

## 2014-03-19 ENCOUNTER — Other Ambulatory Visit: Payer: Self-pay | Admitting: Internal Medicine

## 2014-06-03 ENCOUNTER — Other Ambulatory Visit: Payer: Self-pay | Admitting: Internal Medicine

## 2014-09-15 ENCOUNTER — Other Ambulatory Visit: Payer: Self-pay | Admitting: Internal Medicine

## 2014-09-15 NOTE — Telephone Encounter (Signed)
rx faxed

## 2014-09-15 NOTE — Telephone Encounter (Signed)
Last OV 6.1.15, last refill 1.7.16.  Please advise refill

## 2014-09-15 NOTE — Telephone Encounter (Signed)
Refilled #90 with no refill.  Needs a f/u appt with me.  Please schedule.

## 2014-10-19 ENCOUNTER — Other Ambulatory Visit: Payer: Self-pay | Admitting: Internal Medicine

## 2014-10-22 ENCOUNTER — Encounter: Payer: Self-pay | Admitting: Internal Medicine

## 2014-12-12 ENCOUNTER — Other Ambulatory Visit: Payer: Self-pay | Admitting: Internal Medicine

## 2014-12-12 NOTE — Telephone Encounter (Signed)
Refilled topamax #90 with one refill.

## 2014-12-12 NOTE — Telephone Encounter (Signed)
Ok to fill last ov 12/30/13

## 2015-01-05 ENCOUNTER — Encounter: Payer: Self-pay | Admitting: Internal Medicine

## 2015-01-05 ENCOUNTER — Other Ambulatory Visit (HOSPITAL_COMMUNITY)
Admission: RE | Admit: 2015-01-05 | Discharge: 2015-01-05 | Disposition: A | Discharge status: Home / Self Care | Payer: Managed Care, Other (non HMO) | Source: Ambulatory Visit | Attending: Internal Medicine | Admitting: Internal Medicine

## 2015-01-05 ENCOUNTER — Ambulatory Visit (INDEPENDENT_AMBULATORY_CARE_PROVIDER_SITE_OTHER): Payer: Managed Care, Other (non HMO) | Admitting: Internal Medicine

## 2015-01-05 VITALS — BP 109/71 | HR 61 | Temp 97.8°F | Ht 62.85 in | Wt 135.4 lb

## 2015-01-05 DIAGNOSIS — R51 Headache: Secondary | ICD-10-CM | POA: Diagnosis not present

## 2015-01-05 DIAGNOSIS — Z1239 Encounter for other screening for malignant neoplasm of breast: Secondary | ICD-10-CM | POA: Diagnosis not present

## 2015-01-05 DIAGNOSIS — Z1151 Encounter for screening for human papillomavirus (HPV): Secondary | ICD-10-CM | POA: Insufficient documentation

## 2015-01-05 DIAGNOSIS — N390 Urinary tract infection, site not specified: Secondary | ICD-10-CM

## 2015-01-05 DIAGNOSIS — R102 Pelvic and perineal pain: Secondary | ICD-10-CM

## 2015-01-05 DIAGNOSIS — R519 Headache, unspecified: Secondary | ICD-10-CM

## 2015-01-05 DIAGNOSIS — Z124 Encounter for screening for malignant neoplasm of cervix: Secondary | ICD-10-CM | POA: Diagnosis not present

## 2015-01-05 DIAGNOSIS — Z01419 Encounter for gynecological examination (general) (routine) without abnormal findings: Secondary | ICD-10-CM | POA: Diagnosis present

## 2015-01-05 DIAGNOSIS — Z0000 Encounter for general adult medical examination without abnormal findings: Secondary | ICD-10-CM

## 2015-01-05 NOTE — Progress Notes (Signed)
[name], female   DOB: 30-Apr-1971, 44 y.o.   MRN: [id number]   Subjective:    [name], female    DOB: 1971/03/17, 44 y.o.   MRN: [id number]  HPI  Patient here to follow up on her medical issues as well as for her physical exam.  She is having regular periods.  Periods occurring every 28 days.  They do seem to be a little heavier and lasting a little longer.  Has noticed some discomfort with ovulation on occasion.  Nothing that lasts or is constant.  Tries to stay active.  Breathing stable.  Bowels stable.  Headaches are not as intense.  On topamax.     Past Medical History  Diagnosis Date  . Fainting spell   . Headache(784.0)   . Allergy   . Recurrent urinary tract infection     Current Outpatient Prescriptions on File Prior to Visit  Medication Sig Dispense Refill  . Ascorbic Acid (VITAMIN C PO) Take by mouth daily.    Marland Kitchen lactobacillus acidophilus (BACID) TABS tablet Take 2 tablets by mouth 3 (three) times daily.    Marland Kitchen MAGNESIUM PO Take by mouth daily.    . nitrofurantoin (MACRODANTIN) 50 MG capsule Take 50 mg by mouth daily.    . Probiotic Product (PROBIOTIC DAILY PO) Take by mouth daily.    Marland Kitchen topiramate (TOPAMAX) 50 MG tablet TAKE 1 & 1/2 TABLETS BY MOUTH TWICE A DAY 90 tablet 1   No current facility-administered medications on file prior to visit.    Review of Systems  Constitutional: Negative for appetite change and unexpected weight change.  HENT: Negative for congestion and sinus pressure.   Eyes: Negative for pain and visual disturbance.  Respiratory: Negative for cough, chest tightness and shortness of breath.   Cardiovascular: Negative for chest pain, palpitations and leg swelling.  Gastrointestinal: Negative for nausea, vomiting, abdominal pain and diarrhea.  Genitourinary: Negative for difficulty urinating.       No recent problems with uti.    Musculoskeletal: Negative for back pain and joint swelling.  Skin: Negative for color  change and rash.  Neurological: Negative for dizziness, light-headedness and headaches.  Hematological: Negative for adenopathy. Does not bruise/bleed easily.  Psychiatric/Behavioral: Negative for dysphoric mood and agitation.       Objective:    Physical Exam  Constitutional: She is oriented to person, place, and time. She appears well-developed and well-nourished.  HENT:  Nose: Nose normal.  Mouth/Throat: Oropharynx is clear and moist.  Eyes: Right eye exhibits no discharge. Left eye exhibits no discharge. No scleral icterus.  Neck: Neck supple. No thyromegaly present.  Cardiovascular: Normal rate and regular rhythm.   Pulmonary/Chest: Breath sounds normal. No accessory muscle usage. No tachypnea. No respiratory distress. She has no decreased breath sounds. She has no wheezes. She has no rhonchi. Right breast exhibits no inverted nipple, no mass, no nipple discharge and no tenderness (no axillary adenopathy). Left breast exhibits no inverted nipple, no mass, no nipple discharge and no tenderness (no axilarry adenopathy).  Abdominal: Soft. Bowel sounds are normal. There is no tenderness.  Genitourinary:  Normal external genitalia.  Vaginal vault without lesions.  Cervix identified.  Pap smear performed.  Could not appreciate any adnexal masses or tenderness.    Musculoskeletal: She exhibits no edema or tenderness.  Lymphadenopathy:    She has no cervical adenopathy.  Neurological: She is alert and oriented to person, place, and time.  Skin: Skin is warm.  No rash noted.  Psychiatric: She has a normal mood and affect. Her behavior is normal.    BP 109/71 mmHg  Pulse 61  Temp(Src) 97.8 F (36.6 C) (Oral)  Ht 5' 2.85" (1.596 m)  Wt 135 lb 6 oz (61.406 kg)  BMI 24.11 kg/m2  SpO2 100%  LMP 12/19/2014 Wt Readings from Last 3 Encounters:  01/05/15 135 lb 6 oz (61.406 kg)  12/30/13 139 lb (63.05 kg)  06/03/13 147 lb 4 oz (66.792 kg)     Lab Results  Component Value Date   WBC  7.9 12/30/2013   HGB 12.4 12/30/2013   HCT 38.0 12/30/2013   PLT 267.0 12/30/2013   GLUCOSE 67* 12/30/2013   CHOL 132 12/30/2013   TRIG 35.0 12/30/2013   HDL 46.60 12/30/2013   LDLCALC 78 12/30/2013   ALT 12 12/30/2013   AST 14 12/30/2013   NA 138 12/30/2013   K 4.0 12/30/2013   CL 109 12/30/2013   CREATININE 0.6 12/30/2013   BUN 13 12/30/2013   CO2 22 12/30/2013   TSH 1.86 12/30/2013    Mm Screening Breast Tomo Bilateral  01/13/2014   CLINICAL DATA:  Screening.  EXAM: DIGITAL SCREENING BILATERAL MAMMOGRAM WITH 3D TOMO WITH CAD  COMPARISON:  Previous exam(s).  ACR Breast Density Category c: The breast tissue is heterogeneously dense, which may obscure small masses.  FINDINGS: There are no findings suspicious for malignancy. Images were processed with CAD.  IMPRESSION: No mammographic evidence of malignancy. A result letter of this screening mammogram will be mailed directly to the patient.  RECOMMENDATION: Screening mammogram in one year. (Code:SM-B-01Y)  BI-RADS CATEGORY  1: Negative.   Electronically Signed   By: Britta MccreedySusan  Turner M.D.   On: 01/13/2014 13:57       Assessment & Plan:   Problem List Items Addressed This Visit    Headache    Has seen [facility].  On topamax.  Not as intense.  Follow.        Health care maintenance    Physical today 01/05/15.  PAP 01/05/15.  Mammogram 01/13/14 - Birads I.  Schedule f/u mammogram.        Pelvic pain in female    Reports only minimal discomfort.  Occurs with ovulation - on occasion.  Discussed further w/up.  She declines.  Wants to monitor.        Recurrent urinary tract infection    Not an issue for her lately.  Follow.  Has seen urology.         Other Visit Diagnoses    Breast cancer screening    -  Primary    Relevant Orders    MM DIGITAL SCREENING BILATERAL    Pap smear for cervical cancer screening        Relevant Orders    Cytology - PAP (Completed)        Dale DurhamSCOTT, Jt Brabec, MD

## 2015-01-05 NOTE — Progress Notes (Signed)
Pre visit review using our clinic review tool, if applicable. No additional management support is needed unless otherwise documented below in the visit note. 

## 2015-01-07 LAB — CYTOLOGY - PAP

## 2015-01-08 ENCOUNTER — Encounter: Payer: Self-pay | Admitting: Internal Medicine

## 2015-01-11 ENCOUNTER — Encounter: Payer: Self-pay | Admitting: Internal Medicine

## 2015-01-11 DIAGNOSIS — R102 Pelvic and perineal pain: Secondary | ICD-10-CM | POA: Insufficient documentation

## 2015-01-11 DIAGNOSIS — Z0000 Encounter for general adult medical examination without abnormal findings: Secondary | ICD-10-CM | POA: Insufficient documentation

## 2015-01-11 NOTE — Assessment & Plan Note (Signed)
Has seen Dr Neale Burly at [facility].  On topamax.  Not as intense.  Follow.

## 2015-01-11 NOTE — Assessment & Plan Note (Signed)
Not an issue for her lately.  Follow.  Has seen urology.

## 2015-01-11 NOTE — Assessment & Plan Note (Signed)
Reports only minimal discomfort.  Occurs with ovulation - on occasion.  Discussed further w/up.  She declines.  Wants to monitor.

## 2015-01-11 NOTE — Assessment & Plan Note (Signed)
Physical today 01/05/15.  PAP 01/05/15.  Mammogram 01/13/14 - Birads I.  Schedule f/u mammogram.

## 2015-01-12 NOTE — Telephone Encounter (Signed)
Unread mychart message mailed to patient 

## 2015-02-07 ENCOUNTER — Other Ambulatory Visit: Payer: Self-pay | Admitting: Internal Medicine

## 2015-02-16 ENCOUNTER — Other Ambulatory Visit: Payer: Self-pay | Admitting: Internal Medicine

## 2015-02-16 ENCOUNTER — Ambulatory Visit
Admission: RE | Admit: 2015-02-16 | Discharge: 2015-02-16 | Disposition: A | Discharge status: Home / Self Care | Payer: Managed Care, Other (non HMO) | Source: Ambulatory Visit | Attending: Internal Medicine | Admitting: Internal Medicine

## 2015-02-16 DIAGNOSIS — Z1239 Encounter for other screening for malignant neoplasm of breast: Secondary | ICD-10-CM

## 2015-02-16 DIAGNOSIS — Z1231 Encounter for screening mammogram for malignant neoplasm of breast: Secondary | ICD-10-CM | POA: Insufficient documentation

## 2015-04-10 ENCOUNTER — Other Ambulatory Visit: Payer: Self-pay | Admitting: Internal Medicine

## 2015-06-09 ENCOUNTER — Other Ambulatory Visit: Payer: Self-pay | Admitting: Internal Medicine

## 2015-06-09 NOTE — Telephone Encounter (Signed)
ok'd refill for topamax #90 with one refill.  Needs a f/u appt scheduled.

## 2015-06-09 NOTE — Telephone Encounter (Signed)
Please advise refill? 

## 2015-08-10 ENCOUNTER — Other Ambulatory Visit: Payer: Self-pay | Admitting: Internal Medicine

## 2015-08-10 NOTE — Telephone Encounter (Signed)
Last OV was 02/16/15, Please advise refill request.  Thanks

## 2015-08-10 NOTE — Telephone Encounter (Signed)
ok'd rx for topamax #90 with 2 refills.

## 2015-11-08 ENCOUNTER — Other Ambulatory Visit: Payer: Self-pay | Admitting: Internal Medicine

## 2015-11-16 ENCOUNTER — Encounter: Payer: Self-pay | Admitting: Internal Medicine

## 2015-11-16 MED ORDER — TOPIRAMATE 50 MG PO TABS: ORAL_TABLET | ORAL | Status: DC

## 2015-11-16 NOTE — Telephone Encounter (Signed)
The patient is completely out of her medication per the patient she has been out for several days.

## 2015-11-16 NOTE — Addendum Note (Signed)
Addended by: Warden FillersWRIGHT, Gabrianna Fassnacht S on: 11/16/2015 09:09 AM   Modules accepted: Orders

## 2015-11-16 NOTE — Telephone Encounter (Signed)
Okay to refill? 

## 2016-01-11 ENCOUNTER — Encounter: Payer: Self-pay | Admitting: Internal Medicine

## 2016-01-11 ENCOUNTER — Ambulatory Visit (INDEPENDENT_AMBULATORY_CARE_PROVIDER_SITE_OTHER): Payer: Managed Care, Other (non HMO) | Admitting: Internal Medicine

## 2016-01-11 ENCOUNTER — Other Ambulatory Visit: Payer: Self-pay | Admitting: Internal Medicine

## 2016-01-11 VITALS — BP 100/70 | HR 68 | Temp 97.7°F | Resp 18 | Ht 62.85 in | Wt 139.2 lb

## 2016-01-11 DIAGNOSIS — N920 Excessive and frequent menstruation with regular cycle: Secondary | ICD-10-CM | POA: Diagnosis not present

## 2016-01-11 DIAGNOSIS — Z1239 Encounter for other screening for malignant neoplasm of breast: Secondary | ICD-10-CM | POA: Diagnosis not present

## 2016-01-11 DIAGNOSIS — R51 Headache: Secondary | ICD-10-CM

## 2016-01-11 DIAGNOSIS — Z13220 Encounter for screening for lipoid disorders: Secondary | ICD-10-CM | POA: Diagnosis not present

## 2016-01-11 DIAGNOSIS — R519 Headache, unspecified: Secondary | ICD-10-CM

## 2016-01-11 DIAGNOSIS — Z0000 Encounter for general adult medical examination without abnormal findings: Secondary | ICD-10-CM

## 2016-01-11 DIAGNOSIS — Z139 Encounter for screening, unspecified: Secondary | ICD-10-CM

## 2016-01-11 LAB — COMPREHENSIVE METABOLIC PANEL
ALBUMIN: 3.9 g/dL (ref 3.5–5.2)
ALK PHOS: 36 U/L — AB (ref 39–117)
ALT: 9 U/L (ref 0–35)
AST: 11 U/L (ref 0–37)
BILIRUBIN TOTAL: 0.5 mg/dL (ref 0.2–1.2)
BUN: 15 mg/dL (ref 6–23)
CO2: 24 mEq/L (ref 19–32)
CREATININE: 0.67 mg/dL (ref 0.40–1.20)
Calcium: 8.9 mg/dL (ref 8.4–10.5)
Chloride: 110 mEq/L (ref 96–112)
GFR: 101.45 mL/min (ref >60.00)
GLUCOSE: 88 mg/dL (ref 70–99)
POTASSIUM: 3.6 meq/L (ref 3.5–5.1)
Sodium: 141 mEq/L (ref 135–145)
Total Protein: 6.8 g/dL (ref 6.0–8.3)

## 2016-01-11 LAB — LIPID PANEL
CHOL/HDL RATIO: 3
Cholesterol: 137 mg/dL (ref 0–200)
HDL: 47.4 mg/dL (ref >39.00)
LDL Cholesterol: 84 mg/dL (ref 0–99)
NONHDL: 89.4
Triglycerides: 29 mg/dL (ref 0.0–149.0)
VLDL: 5.8 mg/dL (ref 0.0–40.0)

## 2016-01-11 LAB — CBC WITH DIFFERENTIAL/PLATELET
BASOS ABS: 0.1 10*3/uL (ref 0.0–0.1)
Basophils Relative: 0.9 % (ref 0.0–3.0)
EOS ABS: 0.1 10*3/uL (ref 0.0–0.7)
Eosinophils Relative: 1.9 % (ref 0.0–5.0)
HCT: 36.8 % (ref 36.0–46.0)
Hemoglobin: 12.1 g/dL (ref 12.0–15.0)
LYMPHS ABS: 2.5 10*3/uL (ref 0.7–4.0)
Lymphocytes Relative: 31.3 % (ref 12.0–46.0)
MCHC: 32.9 g/dL (ref 30.0–36.0)
MCV: 80.5 fl (ref 78.0–100.0)
MONOS PCT: 8.2 % (ref 3.0–12.0)
Monocytes Absolute: 0.6 10*3/uL (ref 0.1–1.0)
NEUTROS ABS: 4.6 10*3/uL (ref 1.4–7.7)
NEUTROS PCT: 57.7 % (ref 43.0–77.0)
PLATELETS: 250 10*3/uL (ref 150.0–400.0)
RBC: 4.57 Mil/uL (ref 3.87–5.11)
RDW: 14.1 % (ref 11.5–15.5)
WBC: 7.9 10*3/uL (ref 4.0–10.5)

## 2016-01-11 LAB — TSH: TSH: 1.83 u[IU]/mL (ref 0.35–4.50)

## 2016-01-11 MED ORDER — TOPIRAMATE 50 MG PO TABS: ORAL_TABLET | ORAL | Status: DC

## 2016-01-11 NOTE — Progress Notes (Signed)
Pre-visit discussion using our clinic review tool. No additional management support is needed unless otherwise documented below in the visit note.  

## 2016-01-11 NOTE — Progress Notes (Signed)
[name], female   DOB: 15-Aug-1970, 45 y.o.   MRN: [id number]   Subjective:    [name], female    DOB: Sep 02, 1970, 45 y.o.   MRN: [id number]  HPI  Patient here for her physical exam.  Still having headaches.  On topamax.  Not as intense.  Overall feels stable.  Periods regular.  Occur every 28 days.  Started today.  Breathing stable.  Eating and drinking well.  No nausea or vomiting.  Bowels stable.  Handling stress well.     Past Medical History  Diagnosis Date  . Fainting spell   . Headache(784.0)   . Allergy   . Recurrent urinary tract infection    Past Surgical History  Procedure Laterality Date  . Cesarean section  2008   Family History  Problem Relation Age of Onset  . Hyperlipidemia Mother   . Diabetes Maternal Aunt   . Diabetes Maternal Uncle   . Cancer Maternal Uncle     lung  . Cancer Paternal Aunt     uterus   Social History   Social History  . Marital Status: Married    Spouse Name: N/A  . Number of Children: 2  . Years of Education: N/A   Occupational History  .     Social History Main Topics  . Smoking status: Never Smoker   . Smokeless tobacco: Never Used  . Alcohol Use: 0.0 oz/week    0 Standard drinks or equivalent per week  . Drug Use: No  . Sexual Activity: Not Asked   Other Topics Concern  . None   Social History Narrative    Outpatient Encounter Prescriptions as of 01/11/2016  Medication Sig  . Ascorbic Acid (VITAMIN C PO) Take by mouth daily.  Marland Kitchen lactobacillus acidophilus (BACID) TABS tablet Take 2 tablets by mouth 3 (three) times daily.  Marland Kitchen MAGNESIUM PO Take by mouth daily.  . nitrofurantoin (MACRODANTIN) 50 MG capsule Take 50 mg by mouth daily.  . Probiotic Product (PROBIOTIC DAILY PO) Take by mouth daily.  Marland Kitchen topiramate (TOPAMAX) 50 MG tablet TAKE 1&1/2 TABLETS BY MOUTH 2 TIMES A DAY  . [DISCONTINUED] topiramate (TOPAMAX) 50 MG tablet TAKE 1&1/2 TABLETS BY MOUTH 2 TIMES A DAY   No  facility-administered encounter medications on file as of 01/11/2016.    Review of Systems  Constitutional: Negative for appetite change and unexpected weight change.  HENT: Negative for congestion and sinus pressure.   Eyes: Negative for pain and visual disturbance.  Respiratory: Negative for cough, chest tightness and shortness of breath.   Cardiovascular: Negative for chest pain, palpitations and leg swelling.  Gastrointestinal: Negative for nausea, vomiting, abdominal pain and diarrhea.  Genitourinary: Negative for dysuria and difficulty urinating.  Musculoskeletal: Negative for back pain and joint swelling.  Skin: Negative for color change and rash.  Neurological: Positive for headaches. Negative for dizziness and light-headedness.  Hematological: Negative for adenopathy. Does not bruise/bleed easily.  Psychiatric/Behavioral: Negative for dysphoric mood and agitation.       Objective:    Physical Exam  Constitutional: She is oriented to person, place, and time. She appears well-developed and well-nourished.  HENT:  Nose: Nose normal.  Mouth/Throat: Oropharynx is clear and moist.  Eyes: Right eye exhibits no discharge. Left eye exhibits no discharge. No scleral icterus.  Neck: Neck supple. No thyromegaly present.  Cardiovascular: Normal rate and regular rhythm.   Pulmonary/Chest: Breath sounds normal. No accessory muscle usage. No tachypnea. No respiratory  distress. She has no decreased breath sounds. She has no wheezes. She has no rhonchi. Right breast exhibits no inverted nipple, no mass, no nipple discharge and no tenderness (no axillary adenopathy). Left breast exhibits no inverted nipple, no mass, no nipple discharge and no tenderness (no axilarry adenopathy).  Abdominal: Soft. Bowel sounds are normal. There is no tenderness.  Musculoskeletal: She exhibits no edema or tenderness.  Lymphadenopathy:    She has no cervical adenopathy.  Neurological: She is alert and oriented to  person, place, and time.  Skin: Skin is warm. No rash noted.  Psychiatric: She has a normal mood and affect. Her behavior is normal.    BP 100/70 mmHg  Pulse 68  Temp(Src) 97.7 F (36.5 C) (Oral)  Resp 18  Ht 5' 2.85" (1.596 m)  Wt 139 lb 4 oz (63.163 kg)  BMI 24.80 kg/m2  SpO2 99%  LMP 01/11/2016 (Exact Date) Wt Readings from Last 3 Encounters:  01/11/16 139 lb 4 oz (63.163 kg)  01/05/15 135 lb 6 oz (61.406 kg)  12/30/13 139 lb (63.05 kg)     Lab Results  Component Value Date   WBC 7.9 01/11/2016   HGB 12.1 01/11/2016   HCT 36.8 01/11/2016   PLT 250.0 01/11/2016   GLUCOSE 88 01/11/2016   CHOL 137 01/11/2016   TRIG 29.0 01/11/2016   HDL 47.40 01/11/2016   LDLCALC 84 01/11/2016   ALT 9 01/11/2016   AST 11 01/11/2016   NA 141 01/11/2016   K 3.6 01/11/2016   CL 110 01/11/2016   CREATININE 0.67 01/11/2016   BUN 15 01/11/2016   CO2 24 01/11/2016   TSH 1.83 01/11/2016    Mm Screening Breast Tomo Bilateral  02/16/2015  CLINICAL DATA:  Screening. EXAM: DIGITAL SCREENING BILATERAL MAMMOGRAM WITH 3D TOMO WITH CAD COMPARISON:  Previous exam(s). ACR Breast Density Category d: The breast tissue is extremely dense, which lowers the sensitivity of mammography. FINDINGS: There are no findings suspicious for malignancy. Images were processed with CAD. IMPRESSION: No mammographic evidence of malignancy. A result letter of this screening mammogram will be mailed directly to the patient. RECOMMENDATION: Screening mammogram in one year. (Code:SM-B-01Y) BI-RADS CATEGORY  1: Negative. Electronically Signed   By: Annia Beltrew  Davis M.D.   On: 02/16/2015 11:31       Assessment & Plan:   Problem List Items Addressed This Visit    Headache    Persistent headaches.  On topamax.  Not as intense.  Overall feels stable.  Follow.  Has seen neurology previously.       Relevant Medications   topiramate (TOPAMAX) 50 MG tablet   Other Relevant Orders   Comprehensive metabolic panel (Completed)   TSH  (Completed)   Health care maintenance    Physical today 01/11/16.  PAP 12/2014 - negative with negative HPV.  Mammogram 02/16/15 - Birads I.  Schedule f/u mammogram.        Heavy menses    Check cbc.        Relevant Orders   CBC with Differential/Platelet (Completed)    Other Visit Diagnoses    Screening breast examination    -  Primary    Screening cholesterol level        Relevant Orders    Lipid panel (Completed)        Dale DurhamSCOTT, Willowdean Luhmann, MD

## 2016-01-12 ENCOUNTER — Encounter: Payer: Self-pay | Admitting: Internal Medicine

## 2016-01-12 NOTE — Assessment & Plan Note (Signed)
Persistent headaches.  On topamax.  Not as intense.  Overall feels stable.  Follow.  Has seen neurology previously.

## 2016-01-12 NOTE — Assessment & Plan Note (Signed)
Check cbc 

## 2016-01-12 NOTE — Assessment & Plan Note (Signed)
Physical today 01/11/16.  PAP 12/2014 - negative with negative HPV.  Mammogram 02/16/15 - Birads I.  Schedule f/u mammogram.

## 2016-01-14 ENCOUNTER — Other Ambulatory Visit: Payer: Self-pay | Admitting: Internal Medicine

## 2016-01-15 NOTE — Telephone Encounter (Signed)
Unread mychart message mailed to patient 

## 2016-02-22 ENCOUNTER — Ambulatory Visit
Admission: RE | Admit: 2016-02-22 | Discharge: 2016-02-22 | Disposition: A | Discharge status: Home / Self Care | Payer: Managed Care, Other (non HMO) | Source: Ambulatory Visit | Attending: Internal Medicine | Admitting: Internal Medicine

## 2016-02-22 DIAGNOSIS — Z139 Encounter for screening, unspecified: Secondary | ICD-10-CM

## 2016-04-12 ENCOUNTER — Telehealth: Payer: Self-pay | Admitting: *Deleted

## 2016-04-12 NOTE — Telephone Encounter (Signed)
Pt has requested an appt with Dr.Scott, for hormonal therapy. Pt contact [phone]

## 2016-05-02 ENCOUNTER — Ambulatory Visit (INDEPENDENT_AMBULATORY_CARE_PROVIDER_SITE_OTHER): Payer: Managed Care, Other (non HMO) | Admitting: Internal Medicine

## 2016-05-02 ENCOUNTER — Encounter: Payer: Self-pay | Admitting: Internal Medicine

## 2016-05-02 DIAGNOSIS — R519 Headache, unspecified: Secondary | ICD-10-CM

## 2016-05-02 DIAGNOSIS — R51 Headache: Secondary | ICD-10-CM

## 2016-05-02 NOTE — Progress Notes (Signed)
Pre visit review using our clinic review tool, if applicable. No additional management support is needed unless otherwise documented below in the visit note. 

## 2016-05-02 NOTE — Progress Notes (Signed)
[name], female   DOB: 1970-10-03, 45 y.o.   MRN: [id number]   Subjective:    [name], female    DOB: 07/27/1971, 45 y.o.   MRN: [id number]  HPI  Patient here to discuss her headaches and possible hormone therapy.  States she has had headaches since the age of 45.  States are hormonal.  Related to her menstrual cycle.  States headaches have worsened since the age of 71.  Has been taking topamax.  Does help make the headaches not as intense.  States headaches now lasts 5-8 days.  Takes ibuprofen.  Had no headaches during her pregnancy.  Periods - q 28-29 days.  Heavy periods.  Previously saw Dr Cassandra Matthews.  Took OCPs.  States had headaches occurred by the third month.  Eating and drinking well.  Otherwise doing ok.     Past Medical History:  Diagnosis Date  . Allergy   . Fainting spell   . Headache(784.0)   . Recurrent urinary tract infection    Past Surgical History:  Procedure Laterality Date  . CESAREAN SECTION  2008   Family History  Problem Relation Age of Onset  . Hyperlipidemia Mother   . Diabetes Maternal Aunt   . Diabetes Maternal Uncle   . Cancer Maternal Uncle     lung  . Cancer Paternal Aunt     uterus   Social History   Social History  . Marital status: Married    Spouse name: N/A  . Number of children: 2  . Years of education: N/A   Occupational History  .  [employer]   Social History Main Topics  . Smoking status: Never Smoker  . Smokeless tobacco: Never Used  . Alcohol use 0.0 oz/week  . Drug use: No  . Sexual activity: Not Asked   Other Topics Concern  . None   Social History Narrative  . None    Outpatient Encounter Prescriptions as of 05/02/2016  Medication Sig  . Ascorbic Acid (VITAMIN C PO) Take by mouth daily.  Marland Kitchen lactobacillus acidophilus (BACID) TABS tablet Take 2 tablets by mouth 3 (three) times daily.  Marland Kitchen MAGNESIUM PO Take by mouth daily.  . Probiotic Product (PROBIOTIC DAILY PO) Take by mouth  daily.  Marland Kitchen topiramate (TOPAMAX) 50 MG tablet TAKE 1&1/2 TABLETS BY MOUTH 2 TIMES A DAY  . topiramate (TOPAMAX) 50 MG tablet TAKE 1&1/2 TABLETS BY MOUTH TWICE A DAY  . [DISCONTINUED] nitrofurantoin (MACRODANTIN) 50 MG capsule Take 50 mg by mouth daily.   No facility-administered encounter medications on file as of 05/02/2016.     Review of Systems  Constitutional: Negative for appetite change and unexpected weight change.  HENT: Negative for congestion and sinus pressure.   Respiratory: Negative for cough and shortness of breath.   Gastrointestinal: Negative for nausea and vomiting.  Skin: Negative for color change and rash.  Neurological: Positive for headaches. Negative for dizziness and light-headedness.  Psychiatric/Behavioral: Negative for agitation and dysphoric mood.       Objective:    Physical Exam  Constitutional: She appears well-developed and well-nourished. No distress.  HENT:  Nose: Nose normal.  Mouth/Throat: Oropharynx is clear and moist.  Neck: Neck supple. No thyromegaly present.  Cardiovascular: Normal rate and regular rhythm.   Pulmonary/Chest: Breath sounds normal. No respiratory distress. She has no wheezes.  Abdominal: Soft. Bowel sounds are normal. There is no tenderness.  Musculoskeletal: She exhibits no edema or tenderness.  Lymphadenopathy:  She has no cervical adenopathy.  Skin: No rash noted. No erythema.  Psychiatric: She has a normal mood and affect. Her behavior is normal.    BP 104/64   Pulse (!) 52   Temp 98.4 F (36.9 C) (Oral)   Ht 5\' 3"  (1.6 m)   Wt 143 lb 6.4 oz (65 kg)   LMP 05/02/2016   SpO2 99%   BMI 25.40 kg/m  Wt Readings from Last 3 Encounters:  05/02/16 143 lb 6.4 oz (65 kg)  01/11/16 139 lb 4 oz (63.2 kg)  01/05/15 135 lb 6 oz (61.4 kg)     Lab Results  Component Value Date   WBC 7.9 01/11/2016   HGB 12.1 01/11/2016   HCT 36.8 01/11/2016   PLT 250.0 01/11/2016   GLUCOSE 88 01/11/2016   CHOL 137 01/11/2016    TRIG 29.0 01/11/2016   HDL 47.40 01/11/2016   LDLCALC 84 01/11/2016   ALT 9 01/11/2016   AST 11 01/11/2016   NA 141 01/11/2016   K 3.6 01/11/2016   CL 110 01/11/2016   CREATININE 0.67 01/11/2016   BUN 15 01/11/2016   CO2 24 01/11/2016   TSH 1.83 01/11/2016       Assessment & Plan:   Problem List Items Addressed This Visit    Headache    On topamax.  History of headaches, but are worsening as outlined.  Lasting longer.  Pt states she would like to try estrogen patch.  She is over 40 and with history of migraines.  Does not smoke.  No h/o hypertension.  Discussed estrogen therapy and risk of medication.  Hold on increasing topamax.  Headaches associated with her menstrual cycle.  Discussed with gyn - treatment options.         Other Visit Diagnoses   None.      Dale DurhamSCOTT, Ammi Hutt, MD

## 2016-05-03 ENCOUNTER — Encounter: Payer: Self-pay | Admitting: Internal Medicine

## 2016-05-03 NOTE — Assessment & Plan Note (Signed)
On topamax.  History of headaches, but are worsening as outlined.  Lasting longer.  Pt states she would like to try estrogen patch.  She is over 41 and with history of migraines.  Does not smoke.  No h/o hypertension.  Discussed estrogen therapy and risk of medication.  Hold on increasing topamax.  Headaches associated with her menstrual cycle.  Discussed with gyn - treatment options.

## 2016-05-04 ENCOUNTER — Encounter: Payer: Self-pay | Admitting: Internal Medicine

## 2016-05-10 ENCOUNTER — Encounter: Payer: Self-pay | Admitting: Internal Medicine

## 2016-05-10 NOTE — Telephone Encounter (Signed)
See other message

## 2016-05-12 ENCOUNTER — Telehealth: Payer: Self-pay | Admitting: Internal Medicine

## 2016-05-12 NOTE — Telephone Encounter (Signed)
Please advise 

## 2016-05-12 NOTE — Telephone Encounter (Signed)
Sent pt mychart message

## 2016-05-12 NOTE — Telephone Encounter (Signed)
Pt called following up to find out what the next steps are regarding an estrogen patch. Please advise, thank you!  Call pt @ [phone]

## 2016-05-13 MED ORDER — ESTRADIOL 0.075 MG/24HR TD PTTW: MEDICATED_PATCH | TRANSDERMAL | 1 refills | Status: DC

## 2016-05-13 NOTE — Telephone Encounter (Signed)
Patient notified of your mychart message, please send patch to pharmacy

## 2016-05-13 NOTE — Telephone Encounter (Signed)
rx sent in for minivelle patch #4 with one refill.  My chart message sent to pt on how to use.

## 2016-05-27 ENCOUNTER — Telehealth: Payer: Self-pay | Admitting: *Deleted

## 2016-05-27 NOTE — Telephone Encounter (Signed)
Is this ok?

## 2016-05-27 NOTE — Telephone Encounter (Signed)
ok 

## 2016-05-27 NOTE — Telephone Encounter (Signed)
[facility] approval to dispense pt with a box of minivelle patches of (8). This count would be more cost efficient,also this medication comes in a box of (8) Contact [facility]  [phone]

## 2016-05-27 NOTE — Telephone Encounter (Signed)
Pharmacy notified.

## 2016-06-15 ENCOUNTER — Encounter: Payer: Self-pay | Admitting: Internal Medicine

## 2016-06-22 ENCOUNTER — Ambulatory Visit: Payer: Managed Care, Other (non HMO) | Admitting: Internal Medicine

## 2016-07-22 ENCOUNTER — Encounter: Payer: Self-pay | Admitting: Internal Medicine

## 2016-07-29 ENCOUNTER — Telehealth: Payer: Self-pay | Admitting: Surgical

## 2016-07-29 NOTE — Telephone Encounter (Signed)
LM for patient to return call to see if she received My chart message.

## 2016-12-02 ENCOUNTER — Other Ambulatory Visit: Payer: Self-pay | Admitting: Internal Medicine

## 2016-12-06 ENCOUNTER — Other Ambulatory Visit: Payer: Self-pay | Admitting: *Deleted

## 2016-12-06 NOTE — Telephone Encounter (Signed)
[facility] requested clarity on the Rx for minivelle  Contact [phone]

## 2016-12-06 NOTE — Telephone Encounter (Signed)
Last OV 05/02/16 last filled 12/02/16, med was just refilled

## 2016-12-06 NOTE — Telephone Encounter (Signed)
[facility]formed [facility] she is using one patch at the beginning of her period

## 2017-01-11 ENCOUNTER — Other Ambulatory Visit: Payer: Self-pay | Admitting: Internal Medicine

## 2017-01-11 ENCOUNTER — Ambulatory Visit (INDEPENDENT_AMBULATORY_CARE_PROVIDER_SITE_OTHER): Payer: Managed Care, Other (non HMO) | Admitting: Internal Medicine

## 2017-01-11 ENCOUNTER — Encounter: Payer: Self-pay | Admitting: Internal Medicine

## 2017-01-11 ENCOUNTER — Other Ambulatory Visit (HOSPITAL_COMMUNITY)
Admission: RE | Admit: 2017-01-11 | Discharge: 2017-01-11 | Disposition: A | Discharge status: Home / Self Care | Payer: Managed Care, Other (non HMO) | Source: Ambulatory Visit | Attending: Internal Medicine | Admitting: Internal Medicine

## 2017-01-11 VITALS — BP 106/70 | HR 73 | Temp 98.0°F | Resp 16 | Ht 63.0 in | Wt 146.1 lb

## 2017-01-11 DIAGNOSIS — N920 Excessive and frequent menstruation with regular cycle: Secondary | ICD-10-CM | POA: Diagnosis not present

## 2017-01-11 DIAGNOSIS — Z0000 Encounter for general adult medical examination without abnormal findings: Secondary | ICD-10-CM | POA: Diagnosis not present

## 2017-01-11 DIAGNOSIS — R519 Headache, unspecified: Secondary | ICD-10-CM

## 2017-01-11 DIAGNOSIS — R51 Headache: Secondary | ICD-10-CM

## 2017-01-11 MED ORDER — ESTRADIOL 0.075 MG/24HR TD PTTW: MEDICATED_PATCH | TRANSDERMAL | 1 refills | Status: DC

## 2017-01-11 MED ORDER — MUPIROCIN 2 % EX OINT: TOPICAL_OINTMENT | CUTANEOUS | 0 refills | Status: DC

## 2017-01-11 NOTE — Assessment & Plan Note (Signed)
Physical today 01/11/17.  PAP 01/11/17.  mammogram 02/22/16 - Birads I.  Order placed for f/u mammogram.

## 2017-01-11 NOTE — Progress Notes (Signed)
[name], female   DOB: 1971-06-26, 46 y.o.   MRN: [id number]   Subjective:    [name], female    DOB: [birth year], 46 y.o.   MRN: [id number]  HPI  Patient here for her physical exam.  She reports she is doing relatively well.  States headaches are better.  Utilizing estrogen patch.  States headaches are occurring every other month.  Headaches seem to start mid period..  May last two days.  Overall better.  States otherwise doing well.  Trying to stay active.  No chest pain.  No sob.  No acid reflux.  No abdominal pain.  Bowels moving.     Past Medical History:  Diagnosis Date  . Allergy   . Fainting spell   . Headache(784.0)   . Recurrent urinary tract infection    Past Surgical History:  Procedure Laterality Date  . CESAREAN SECTION  2008   Family History  Problem Relation Age of Onset  . Hyperlipidemia Mother   . Diabetes Maternal Aunt   . Diabetes Maternal Uncle   . Cancer Maternal Uncle        lung  . Cancer Paternal Aunt        uterus   Social History   Social History  . Marital status: Married    Spouse name: N/A  . Number of children: 2  . Years of education: N/A   Occupational History  .  [employer]   Social History Main Topics  . Smoking status: Never Smoker  . Smokeless tobacco: Never Used  . Alcohol use 0.0 oz/week  . Drug use: No  . Sexual activity: Not Asked   Other Topics Concern  . None   Social History Narrative  . None    Outpatient Encounter Prescriptions as of 01/11/2017  Medication Sig  . Ascorbic Acid (VITAMIN C PO) Take by mouth daily.  Marland Kitchen estradiol (MINIVELLE) 0.075 MG/24HR Apply one patch at the beginning of period and then change after 3 days as directed.  . lactobacillus acidophilus (BACID) TABS tablet Take 2 tablets by mouth 3 (three) times daily.  . Probiotic Product (PROBIOTIC DAILY PO) Take by mouth daily.  Marland Kitchen topiramate (TOPAMAX) 50 MG tablet TAKE 1&1/2 TABLETS BY MOUTH TWICE A DAY  .  topiramate (TOPAMAX) 50 MG tablet TAKE 1 & 1/2 TABLETS BY MOUTH 2 TIMES A DAY  . [DISCONTINUED] MINIVELLE 0.075 MG/24HR APPLY ONE PATCH AT THE BEGINNING OF EACH PERIOD.  Marland Kitchen MAGNESIUM PO Take by mouth daily.  . mupirocin ointment (BACTROBAN) 2 % Apply to affected area bid   No facility-administered encounter medications on file as of 01/11/2017.     Review of Systems  Constitutional: Negative for appetite change and unexpected weight change.  HENT: Negative for congestion and sinus pressure.   Eyes: Negative for pain and visual disturbance.  Respiratory: Negative for cough, chest tightness and shortness of breath.   Cardiovascular: Negative for chest pain, palpitations and leg swelling.  Gastrointestinal: Negative for abdominal pain, diarrhea, nausea and vomiting.  Genitourinary: Negative for difficulty urinating and dysuria.  Musculoskeletal: Negative for back pain and joint swelling.  Skin: Negative for color change and rash.  Neurological: Negative for dizziness, light-headedness and headaches.  Hematological: Negative for adenopathy. Does not bruise/bleed easily.  Psychiatric/Behavioral: Negative for agitation and dysphoric mood.       Objective:    Physical Exam  Constitutional: She is oriented to person, place, and time. She appears well-developed and  well-nourished. No distress.  HENT:  Nose: Nose normal.  Mouth/Throat: Oropharynx is clear and moist.  Eyes: Right eye exhibits no discharge. Left eye exhibits no discharge. No scleral icterus.  Neck: Neck supple. No thyromegaly present.  Cardiovascular: Normal rate and regular rhythm.   Pulmonary/Chest: Breath sounds normal. No accessory muscle usage. No tachypnea. No respiratory distress. She has no decreased breath sounds. She has no wheezes. She has no rhonchi. Right breast exhibits no inverted nipple, no mass, no nipple discharge and no tenderness (no axillary adenopathy). Left breast exhibits no inverted nipple, no mass, no  nipple discharge and no tenderness (no axilarry adenopathy).  Abdominal: Soft. Bowel sounds are normal. There is no tenderness.  Genitourinary:  Genitourinary Comments: Normal external genitalia.  Vaginal vault without lesions.  Cervix identified.  Pap smear performed.  Could not appreciate any adnexal masses or tenderness.    Musculoskeletal: She exhibits no edema or tenderness.  Lymphadenopathy:    She has no cervical adenopathy.  Neurological: She is alert and oriented to person, place, and time.  Skin: Skin is warm. No rash noted. No erythema.  Psychiatric: She has a normal mood and affect. Her behavior is normal.    BP 106/70 (BP Location: Left Arm, Patient Position: Sitting, Cuff Size: Normal)   Pulse 73   Temp 98 F (36.7 C) (Oral)   Resp 16   Ht 5\' 3"  (1.6 m)   Wt 146 lb 2 oz (66.3 kg)   LMP 12/26/2016 (Exact Date)   SpO2 98%   BMI 25.88 kg/m  Wt Readings from Last 3 Encounters:  01/11/17 146 lb 2 oz (66.3 kg)  05/02/16 143 lb 6.4 oz (65 kg)  01/11/16 139 lb 4 oz (63.2 kg)     Lab Results  Component Value Date   WBC 7.9 01/11/2016   HGB 12.1 01/11/2016   HCT 36.8 01/11/2016   PLT 250.0 01/11/2016   GLUCOSE 88 01/11/2016   CHOL 137 01/11/2016   TRIG 29.0 01/11/2016   HDL 47.40 01/11/2016   LDLCALC 84 01/11/2016   ALT 9 01/11/2016   AST 11 01/11/2016   NA 141 01/11/2016   K 3.6 01/11/2016   CL 110 01/11/2016   CREATININE 0.67 01/11/2016   BUN 15 01/11/2016   CO2 24 01/11/2016   TSH 1.83 01/11/2016    Mm Screening Breast Tomo Bilateral  Result Date: 02/22/2016 CLINICAL DATA:  Screening. EXAM: 2D DIGITAL SCREENING BILATERAL MAMMOGRAM WITH CAD AND ADJUNCT TOMO COMPARISON:  Previous exam(s). ACR Breast Density Category c: The breast tissue is heterogeneously dense, which may obscure small masses. FINDINGS: There are no findings suspicious for malignancy. Images were processed with CAD. IMPRESSION: No mammographic evidence of malignancy. A result letter of this  screening mammogram will be mailed directly to the patient. RECOMMENDATION: Screening mammogram in one year. (Code:SM-B-01Y) BI-RADS CATEGORY  1: Negative. Electronically Signed   By: Bary Richard M.D.   On: 02/22/2016 10:50      Assessment & Plan:   Problem List Items Addressed This Visit    Headache    On topamax.  Now utilizing estrogen patches which have helped.  Will have her change her patch after 3 days and see if this helps.  Will give me an update over the next couple of months.        Health care maintenance    Physical today 01/11/17.  PAP 01/11/17.  mammogram 02/22/16 - Birads I.  Order placed for f/u mammogram.  Relevant Orders   MM Digital Screening   Cytology - PAP (Completed)   Heavy menses    Periods better utilizing estrogen patch.  Follow.         Other Visit Diagnoses    Routine general medical examination at a health care facility    -  Primary       Dale DurhamSCOTT, Gavino Fouch, MD

## 2017-01-13 LAB — CYTOLOGY - PAP
Diagnosis: NEGATIVE
HPV (WINDOPATH): NOT DETECTED

## 2017-01-14 ENCOUNTER — Encounter: Payer: Self-pay | Admitting: Internal Medicine

## 2017-01-14 NOTE — Assessment & Plan Note (Signed)
Periods better utilizing estrogen patch.  Follow.

## 2017-01-14 NOTE — Assessment & Plan Note (Signed)
On topamax.  Now utilizing estrogen patches which have helped.  Will have her change her patch after 3 days and see if this helps.  Will give me an update over the next couple of months.

## 2017-01-15 ENCOUNTER — Encounter: Payer: Self-pay | Admitting: Internal Medicine

## 2017-01-16 ENCOUNTER — Encounter: Payer: Managed Care, Other (non HMO) | Admitting: Internal Medicine

## 2017-01-23 ENCOUNTER — Other Ambulatory Visit: Payer: Self-pay | Admitting: Internal Medicine

## 2017-01-23 DIAGNOSIS — Z1231 Encounter for screening mammogram for malignant neoplasm of breast: Secondary | ICD-10-CM

## 2017-02-27 ENCOUNTER — Ambulatory Visit
Admission: RE | Admit: 2017-02-27 | Discharge: 2017-02-27 | Disposition: A | Discharge status: Home / Self Care | Payer: Managed Care, Other (non HMO) | Source: Ambulatory Visit | Attending: Internal Medicine | Admitting: Internal Medicine

## 2017-02-27 DIAGNOSIS — Z1231 Encounter for screening mammogram for malignant neoplasm of breast: Secondary | ICD-10-CM

## 2017-07-17 ENCOUNTER — Ambulatory Visit: Payer: Managed Care, Other (non HMO) | Admitting: Internal Medicine

## 2017-07-17 ENCOUNTER — Encounter: Payer: Self-pay | Admitting: Internal Medicine

## 2017-07-17 VITALS — BP 112/66 | HR 61 | Temp 97.9°F | Ht 63.0 in | Wt 149.4 lb

## 2017-07-17 DIAGNOSIS — R519 Headache, unspecified: Secondary | ICD-10-CM

## 2017-07-17 DIAGNOSIS — R51 Headache: Secondary | ICD-10-CM

## 2017-07-17 DIAGNOSIS — N920 Excessive and frequent menstruation with regular cycle: Secondary | ICD-10-CM

## 2017-07-17 DIAGNOSIS — Z23 Encounter for immunization: Secondary | ICD-10-CM | POA: Diagnosis not present

## 2017-07-17 NOTE — Progress Notes (Signed)
[name], female   DOB: May 27, 1971, 46 y.o.   MRN: [id number]   Subjective:    [name], female    DOB: [birth year], 46 y.o.   MRN: [id number]  HPI  Patient here for a scheduled follow up.  States she is doing relatively well.  Still having headaches.  May occur before her period and during.  Seems to occur around her period.  States that two months ago, she had a headache that lasted 9 days.  Takes an increased amount of ibuprofen when has headaches.  She has seen headache specialist.  On topamax.  She still feels related to hormone changes.  I discussed this with gyn.  Recommended trying estrogen patches.  She is using these patches at the start of her period.  Last visit, thought was helping.  Presents today with headaches as outlined.  Discussed with her today regarding f/u with neurology.  She declines.  Feels related to hormones and her menstrual cycle.  Discussed gyn referral for evaluation of question of need for hormone manipulation. States otherwise doing well.  No sob.  No vomiting.  No abdominal pain.  Bowels moving.        Past Medical History:  Diagnosis Date  . Allergy   . Fainting spell   . Headache(784.0)   . Recurrent urinary tract infection    Past Surgical History:  Procedure Laterality Date  . CESAREAN SECTION  2008   Family History  Problem Relation Age of Onset  . Hyperlipidemia Mother   . Diabetes Maternal Aunt   . Diabetes Maternal Uncle   . Cancer Maternal Uncle        lung  . Cancer Paternal Aunt        uterus   Social History   Socioeconomic History  . Marital status: Married    Spouse name: None  . Number of children: 2  . Years of education: None  . Highest education level: None  Social Needs  . Financial resource strain: None  . Food insecurity - worry: None  . Food insecurity - inability: None  . Transportation needs - medical: None  . Transportation needs - non-medical: None  Occupational History   Employer: [employer]  Tobacco Use  . Smoking status: Never Smoker  . Smokeless tobacco: Never Used  Substance and Sexual Activity  . Alcohol use: Yes    Alcohol/week: 0.0 oz  . Drug use: No  . Sexual activity: None  Other Topics Concern  . None  Social History Narrative  . None    Outpatient Encounter Medications as of 07/17/2017  Medication Sig  . Ascorbic Acid (VITAMIN C PO) Take by mouth daily.  Marland Kitchen estradiol (MINIVELLE) 0.075 MG/24HR Apply one patch at the beginning of period and then change after 3 days as directed.  . lactobacillus acidophilus (BACID) TABS tablet Take 2 tablets by mouth 3 (three) times daily.  Marland Kitchen MAGNESIUM PO Take by mouth daily.  . mupirocin ointment (BACTROBAN) 2 % Apply to affected area bid  . Probiotic Product (PROBIOTIC DAILY PO) Take by mouth daily.  Marland Kitchen topiramate (TOPAMAX) 50 MG tablet TAKE 1&1/2 TABLETS BY MOUTH TWICE A DAY  . topiramate (TOPAMAX) 50 MG tablet TAKE 1 & 1/2 TABLETS BY MOUTH 2 TIMES A DAY   No facility-administered encounter medications on file as of 07/17/2017.     Review of Systems  Constitutional: Negative for appetite change and unexpected weight change.  HENT: Negative for congestion  and sinus pressure.   Respiratory: Negative for cough, chest tightness and shortness of breath.   Cardiovascular: Negative for chest pain, palpitations and leg swelling.  Gastrointestinal: Negative for abdominal pain, diarrhea, nausea and vomiting.  Genitourinary: Negative for difficulty urinating and dysuria.  Musculoskeletal: Negative for joint swelling and myalgias.  Skin: Negative for color change and rash.  Neurological: Positive for headaches. Negative for dizziness and light-headedness.  Psychiatric/Behavioral: Negative for agitation and dysphoric mood.       Objective:    Physical Exam  Constitutional: She appears well-developed and well-nourished. No distress.  HENT:  Nose: Nose normal.  Mouth/Throat: Oropharynx is clear and  moist.  Neck: Neck supple. No thyromegaly present.  Cardiovascular: Normal rate and regular rhythm.  Pulmonary/Chest: Breath sounds normal. No respiratory distress. She has no wheezes.  Abdominal: Soft. Bowel sounds are normal. There is no tenderness.  Musculoskeletal: She exhibits no edema or tenderness.  Lymphadenopathy:    She has no cervical adenopathy.  Skin: No rash noted. No erythema.  Psychiatric: She has a normal mood and affect. Her behavior is normal.    BP 112/66   Pulse 61   Temp 97.9 F (36.6 C) (Oral)   Ht 5\' 3"  (1.6 m)   Wt 149 lb 6.4 oz (67.8 kg)   SpO2 98%   BMI 26.47 kg/m  Wt Readings from Last 3 Encounters:  07/17/17 149 lb 6.4 oz (67.8 kg)  01/11/17 146 lb 2 oz (66.3 kg)  05/02/16 143 lb 6.4 oz (65 kg)     Lab Results  Component Value Date   WBC 7.9 01/11/2016   HGB 12.1 01/11/2016   HCT 36.8 01/11/2016   PLT 250.0 01/11/2016   GLUCOSE 88 01/11/2016   CHOL 137 01/11/2016   TRIG 29.0 01/11/2016   HDL 47.40 01/11/2016   LDLCALC 84 01/11/2016   ALT 9 01/11/2016   AST 11 01/11/2016   NA 141 01/11/2016   K 3.6 01/11/2016   CL 110 01/11/2016   CREATININE 0.67 01/11/2016   BUN 15 01/11/2016   CO2 24 01/11/2016   TSH 1.83 01/11/2016    Mm Screening Breast Tomo Bilateral  Result Date: 02/27/2017 CLINICAL DATA:  Screening. EXAM: 2D DIGITAL SCREENING BILATERAL MAMMOGRAM WITH CAD AND ADJUNCT TOMO COMPARISON:  Previous exam(s). ACR Breast Density Category c: The breast tissue is heterogeneously dense, which may obscure small masses. FINDINGS: There are no findings suspicious for malignancy. Images were processed with CAD. IMPRESSION: No mammographic evidence of malignancy. A result letter of this screening mammogram will be mailed directly to the patient. RECOMMENDATION: Screening mammogram in one year. (Code:SM-B-01Y) BI-RADS CATEGORY  1: Negative. Electronically Signed   By: Baird Lyonsina  Arceo M.D.   On: 02/27/2017 16:13       Assessment & Plan:   Problem  List Items Addressed This Visit    Headache - Primary    Discussed with her today.  Headaches as outlined.  She feels related to hormones and her menstrual cycle.  Saw headache specialist.  On topamax.  Discussed with gyn.  Trial of estrogen patch as outlined.  Given headaches as outlined, will refer her to gyn for evaluation and question of other treatment options for headaches (and hormone manipulation).        Relevant Orders   Ambulatory referral to Gynecology   Heavy menses    Has a history of heavy menses.  Refer to gyn as outlined.        Relevant Orders   Ambulatory referral  to Gynecology    Other Visit Diagnoses    Need for immunization against influenza       Relevant Orders   Flu Vaccine QUAD 36+ mos IM (Completed)       Dale DurhamSCOTT, Compton Brigance, MD

## 2017-07-17 NOTE — Progress Notes (Signed)
Pre visit review using our clinic review tool, if applicable. No additional management support is needed unless otherwise documented below in the visit note. 

## 2017-07-19 ENCOUNTER — Encounter: Payer: Self-pay | Admitting: Internal Medicine

## 2017-07-19 NOTE — Assessment & Plan Note (Signed)
Discussed with her today.  Headaches as outlined.  She feels related to hormones and her menstrual cycle.  Saw headache specialist.  On topamax.  Discussed with gyn.  Trial of estrogen patch as outlined.  Given headaches as outlined, will refer her to gyn for evaluation and question of other treatment options for headaches (and hormone manipulation).

## 2017-07-19 NOTE — Assessment & Plan Note (Signed)
Has a history of heavy menses.  Refer to gyn as outlined.

## 2017-11-03 ENCOUNTER — Other Ambulatory Visit: Payer: Self-pay | Admitting: Internal Medicine

## 2017-11-25 ENCOUNTER — Other Ambulatory Visit: Payer: Self-pay | Admitting: Internal Medicine

## 2018-01-22 ENCOUNTER — Ambulatory Visit (INDEPENDENT_AMBULATORY_CARE_PROVIDER_SITE_OTHER): Payer: Managed Care, Other (non HMO) | Admitting: Internal Medicine

## 2018-01-22 ENCOUNTER — Encounter: Payer: Self-pay | Admitting: Internal Medicine

## 2018-01-22 VITALS — BP 120/76 | HR 86 | Temp 98.3°F | Resp 18 | Ht 63.0 in | Wt 149.6 lb

## 2018-01-22 DIAGNOSIS — R519 Headache, unspecified: Secondary | ICD-10-CM

## 2018-01-22 DIAGNOSIS — R51 Headache: Secondary | ICD-10-CM | POA: Diagnosis not present

## 2018-01-22 DIAGNOSIS — Z1231 Encounter for screening mammogram for malignant neoplasm of breast: Secondary | ICD-10-CM | POA: Diagnosis not present

## 2018-01-22 DIAGNOSIS — Z0000 Encounter for general adult medical examination without abnormal findings: Secondary | ICD-10-CM | POA: Diagnosis not present

## 2018-01-22 DIAGNOSIS — Z1239 Encounter for other screening for malignant neoplasm of breast: Secondary | ICD-10-CM

## 2018-01-22 NOTE — Progress Notes (Signed)
[name], female   DOB: 16-Feb-1971, 47 y.o.   MRN: [id number]   Subjective:    [name], female    DOB: 16-Feb-1971, 47 y.o.   MRN: [id number]  HPI  Patient here for her physical exam.  States she is doing relatively well.  Going to pick up her son next week.  He has completed his time in [employer].  Tries to stay active.  No chest pain.  No sob.  No acid reflux.  No abdominal pain.  Bowels moving.  Still with headaches.  Not lasting as long.  Saw gyn.  Feels headaches are hormonal.  vivelle dot was stopped.  Started on estrogen/progesterone pill with short placebo interval.  She plans to f/u with gyn regarding this.     Past Medical History:  Diagnosis Date  . Allergy   . Fainting spell   . Headache(784.0)   . Recurrent urinary tract infection    Past Surgical History:  Procedure Laterality Date  . CESAREAN SECTION  2008   Family History  Problem Relation Age of Onset  . Hyperlipidemia Mother   . Diabetes Maternal Aunt   . Diabetes Maternal Uncle   . Cancer Maternal Uncle        lung  . Cancer Paternal Aunt        uterus   Social History   Socioeconomic History  . Marital status: Married    Spouse name: Not on file  . Number of children: 2  . Years of education: Not on file  . Highest education level: Not on file  Occupational History    Employer: [employer]  Social Needs  . Financial resource strain: Not on file  . Food insecurity:    Worry: Not on file    Inability: Not on file  . Transportation needs:    Medical: Not on file    Non-medical: Not on file  Tobacco Use  . Smoking status: Never Smoker  . Smokeless tobacco: Never Used  Substance and Sexual Activity  . Alcohol use: Yes    Alcohol/week: 0.0 oz  . Drug use: No  . Sexual activity: Not on file  Lifestyle  . Physical activity:    Days per week: Not on file    Minutes per session: Not on file  . Stress: Not on file  Relationships  . Social connections:   Talks on phone: Not on file    Gets together: Not on file    Attends religious service: Not on file    Active member of club or organization: Not on file    Attends meetings of clubs or organizations: Not on file    Relationship status: Not on file  Other Topics Concern  . Not on file  Social History Narrative  . Not on file    Outpatient Encounter Medications as of 01/22/2018  Medication Sig  . Ascorbic Acid (VITAMIN C PO) Take by mouth daily.  Marland Kitchen. BLISOVI 24 FE 1-20 MG-MCG(24) tablet Take 1 tablet by mouth daily.  . butalbital-acetaminophen-caffeine (FIORICET, ESGIC) 50-325-40 MG tablet Take 1-2 tablets by mouth as needed. 1-2 tablets at onset of headache then can repeat 1 tablet 6 hours later as needed.  . lactobacillus acidophilus (BACID) TABS tablet Take 2 tablets by mouth 3 (three) times daily.  Marland Kitchen. MAGNESIUM PO Take by mouth daily.  Marland Kitchen. MINIVELLE 0.075 MG/24HR APPLY ONE PATCH AT THE BEGINNING OF PERIOD AND THEN CHANGE AFTER 3 DAYS AS DIRECTED.  .Marland Kitchen  mupirocin ointment (BACTROBAN) 2 % Apply to affected area bid  . Probiotic Product (PROBIOTIC DAILY PO) Take by mouth daily.  Marland Kitchen topiramate (TOPAMAX) 50 MG tablet TAKE 1&1/2 TABLETS BY MOUTH TWICE A DAY  . topiramate (TOPAMAX) 50 MG tablet TAKE 1 & 1/2 TABLETS BY MOUTH 2 TIMES A DAY   No facility-administered encounter medications on file as of 01/22/2018.     Review of Systems  Constitutional: Negative for appetite change and unexpected weight change.  HENT: Negative for congestion and sinus pressure.   Eyes: Negative for pain and visual disturbance.  Respiratory: Negative for cough, chest tightness and shortness of breath.   Cardiovascular: Negative for chest pain, palpitations and leg swelling.  Gastrointestinal: Negative for abdominal pain, diarrhea, nausea and vomiting.  Genitourinary: Negative for difficulty urinating and dysuria.  Musculoskeletal: Negative for joint swelling and myalgias.  Skin: Negative for color change and rash.    Neurological: Positive for headaches. Negative for dizziness and light-headedness.  Hematological: Negative for adenopathy. Does not bruise/bleed easily.  Psychiatric/Behavioral: Negative for agitation and dysphoric mood.       Objective:     Blood pressure rechecked by me:  120/76  Physical Exam  Constitutional: She is oriented to person, place, and time. She appears well-developed and well-nourished. No distress.  HENT:  Nose: Nose normal.  Mouth/Throat: Oropharynx is clear and moist.  Eyes: Right eye exhibits no discharge. Left eye exhibits no discharge. No scleral icterus.  Neck: Neck supple. No thyromegaly present.  Cardiovascular: Normal rate and regular rhythm.  Pulmonary/Chest: Breath sounds normal. No accessory muscle usage. No tachypnea. No respiratory distress. She has no decreased breath sounds. She has no wheezes. She has no rhonchi. Right breast exhibits no inverted nipple, no mass, no nipple discharge and no tenderness (no axillary adenopathy). Left breast exhibits no inverted nipple, no mass, no nipple discharge and no tenderness (no axilarry adenopathy).  Abdominal: Soft. Bowel sounds are normal. There is no tenderness.  Musculoskeletal: She exhibits no edema or tenderness.  Lymphadenopathy:    She has no cervical adenopathy.  Neurological: She is alert and oriented to person, place, and time.  Skin: No rash noted. No erythema.  Psychiatric: She has a normal mood and affect. Her behavior is normal.    BP 120/76   Pulse 86   Temp 98.3 F (36.8 C) (Oral)   Resp 18   Ht 5\' 3"  (1.6 m)   Wt 149 lb 9.6 oz (67.9 kg)   SpO2 97%   BMI 26.50 kg/m  Wt Readings from Last 3 Encounters:  01/22/18 149 lb 9.6 oz (67.9 kg)  07/17/17 149 lb 6.4 oz (67.8 kg)  01/11/17 146 lb 2 oz (66.3 kg)     Lab Results  Component Value Date   WBC 7.9 01/11/2016   HGB 12.1 01/11/2016   HCT 36.8 01/11/2016   PLT 250.0 01/11/2016   GLUCOSE 88 01/11/2016   CHOL 137 01/11/2016    TRIG 29.0 01/11/2016   HDL 47.40 01/11/2016   LDLCALC 84 01/11/2016   ALT 9 01/11/2016   AST 11 01/11/2016   NA 141 01/11/2016   K 3.6 01/11/2016   CL 110 01/11/2016   CREATININE 0.67 01/11/2016   BUN 15 01/11/2016   CO2 24 01/11/2016   TSH 1.83 01/11/2016    Mm Screening Breast Tomo Bilateral  Result Date: 02/27/2017 CLINICAL DATA:  Screening. EXAM: 2D DIGITAL SCREENING BILATERAL MAMMOGRAM WITH CAD AND ADJUNCT TOMO COMPARISON:  Previous exam(s). ACR Breast Density Category  c: The breast tissue is heterogeneously dense, which may obscure small masses. FINDINGS: There are no findings suspicious for malignancy. Images were processed with CAD. IMPRESSION: No mammographic evidence of malignancy. A result letter of this screening mammogram will be mailed directly to the patient. RECOMMENDATION: Screening mammogram in one year. (Code:SM-B-01Y) BI-RADS CATEGORY  1: Negative. Electronically Signed   By: Baird Lyons M.D.   On: 02/27/2017 16:13       Assessment & Plan:   Problem List Items Addressed This Visit    Headache    Still with persistent headaches as outlined.  Not as long in duration.  Seeing gyn.  Plans to f/u with them to discuss treatment options.        Relevant Medications   butalbital-acetaminophen-caffeine (FIORICET, ESGIC) 50-325-40 MG tablet   Health care maintenance    Physical today 01/22/18.  PAP 01/11/17 - negative with negative HPV.  Mammogram 02/27/17 - birads I.  Ordered f/u mammogram.         Other Visit Diagnoses    Breast cancer screening    -  Primary   Relevant Orders   MM 3D SCREEN BREAST BILATERAL       Dale Alleman, MD

## 2018-01-27 ENCOUNTER — Encounter: Payer: Self-pay | Admitting: Internal Medicine

## 2018-01-27 NOTE — Assessment & Plan Note (Signed)
Physical today 01/22/18.  PAP 01/11/17 - negative with negative HPV.  Mammogram 02/27/17 - birads I.  Ordered f/u mammogram.

## 2018-01-27 NOTE — Assessment & Plan Note (Signed)
Still with persistent headaches as outlined.  Not as long in duration.  Seeing gyn.  Plans to f/u with them to discuss treatment options.

## 2018-03-12 ENCOUNTER — Other Ambulatory Visit: Payer: Self-pay | Admitting: Internal Medicine

## 2018-03-12 ENCOUNTER — Ambulatory Visit
Admission: RE | Admit: 2018-03-12 | Discharge: 2018-03-12 | Disposition: A | Discharge status: Home / Self Care | Payer: Managed Care, Other (non HMO) | Source: Ambulatory Visit | Attending: Internal Medicine | Admitting: Internal Medicine

## 2018-03-12 DIAGNOSIS — Z1239 Encounter for other screening for malignant neoplasm of breast: Secondary | ICD-10-CM

## 2018-03-12 DIAGNOSIS — R928 Other abnormal and inconclusive findings on diagnostic imaging of breast: Secondary | ICD-10-CM

## 2018-03-12 NOTE — Progress Notes (Signed)
Orders placed for f/u right breast mammogram and ultrasound.   

## 2018-03-13 ENCOUNTER — Other Ambulatory Visit: Payer: Self-pay | Admitting: Internal Medicine

## 2018-03-13 DIAGNOSIS — R921 Mammographic calcification found on diagnostic imaging of breast: Secondary | ICD-10-CM

## 2018-03-16 ENCOUNTER — Ambulatory Visit
Admission: RE | Admit: 2018-03-16 | Discharge: 2018-03-16 | Disposition: A | Discharge status: Home / Self Care | Payer: Managed Care, Other (non HMO) | Source: Ambulatory Visit | Attending: Internal Medicine | Admitting: Internal Medicine

## 2018-03-16 ENCOUNTER — Other Ambulatory Visit: Payer: Self-pay | Admitting: Internal Medicine

## 2018-03-16 DIAGNOSIS — R921 Mammographic calcification found on diagnostic imaging of breast: Secondary | ICD-10-CM

## 2018-03-19 ENCOUNTER — Other Ambulatory Visit: Payer: Self-pay | Admitting: Internal Medicine

## 2018-03-19 DIAGNOSIS — R928 Other abnormal and inconclusive findings on diagnostic imaging of breast: Secondary | ICD-10-CM

## 2018-03-19 NOTE — Progress Notes (Signed)
Order placed for f/u right breast mammogram and ultrasound  

## 2018-04-14 ENCOUNTER — Other Ambulatory Visit: Payer: Self-pay | Admitting: Internal Medicine

## 2018-09-17 ENCOUNTER — Other Ambulatory Visit: Payer: Self-pay | Admitting: Internal Medicine

## 2018-09-17 ENCOUNTER — Ambulatory Visit
Admission: RE | Admit: 2018-09-17 | Discharge: 2018-09-17 | Disposition: A | Discharge status: Home / Self Care | Payer: Managed Care, Other (non HMO) | Source: Ambulatory Visit | Attending: Internal Medicine | Admitting: Internal Medicine

## 2018-09-17 DIAGNOSIS — R921 Mammographic calcification found on diagnostic imaging of breast: Secondary | ICD-10-CM

## 2018-11-25 ENCOUNTER — Other Ambulatory Visit: Payer: Self-pay | Admitting: Internal Medicine

## 2018-11-26 NOTE — Telephone Encounter (Signed)
topamax ok'd for 3 month supply.

## 2018-11-26 NOTE — Telephone Encounter (Signed)
Last OV 01/22/2018   Last refilled 04/18/2018 disp 270 with 1 refill   Next OV 6/29//2020  Sent to PCP for approval

## 2019-01-28 ENCOUNTER — Other Ambulatory Visit (HOSPITAL_COMMUNITY)
Admission: RE | Admit: 2019-01-28 | Discharge: 2019-01-28 | Disposition: A | Discharge status: Home / Self Care | Payer: Managed Care, Other (non HMO) | Source: Ambulatory Visit | Attending: Internal Medicine | Admitting: Internal Medicine

## 2019-01-28 ENCOUNTER — Other Ambulatory Visit: Payer: Self-pay

## 2019-01-28 ENCOUNTER — Ambulatory Visit (INDEPENDENT_AMBULATORY_CARE_PROVIDER_SITE_OTHER): Payer: Managed Care, Other (non HMO) | Admitting: Internal Medicine

## 2019-01-28 VITALS — BP 132/84 | HR 95 | Temp 98.0°F | Ht 63.0 in | Wt 154.8 lb

## 2019-01-28 DIAGNOSIS — Z13220 Encounter for screening for lipoid disorders: Secondary | ICD-10-CM

## 2019-01-28 DIAGNOSIS — R51 Headache: Secondary | ICD-10-CM

## 2019-01-28 DIAGNOSIS — N889 Noninflammatory disorder of cervix uteri, unspecified: Secondary | ICD-10-CM

## 2019-01-28 DIAGNOSIS — Z0000 Encounter for general adult medical examination without abnormal findings: Secondary | ICD-10-CM | POA: Diagnosis not present

## 2019-01-28 DIAGNOSIS — R519 Headache, unspecified: Secondary | ICD-10-CM

## 2019-01-28 LAB — CBC WITH DIFFERENTIAL/PLATELET
Basophils Absolute: 0.1 10*3/uL (ref 0.0–0.1)
Basophils Relative: 0.9 % (ref 0.0–3.0)
Eosinophils Absolute: 0.2 10*3/uL (ref 0.0–0.7)
Eosinophils Relative: 3.3 % (ref 0.0–5.0)
HCT: 41.1 % (ref 36.0–46.0)
Hemoglobin: 13.3 g/dL (ref 12.0–15.0)
Lymphocytes Relative: 30.5 % (ref 12.0–46.0)
Lymphs Abs: 2.2 10*3/uL (ref 0.7–4.0)
MCHC: 32.4 g/dL (ref 30.0–36.0)
MCV: 82.9 fl (ref 78.0–100.0)
Monocytes Absolute: 0.6 10*3/uL (ref 0.1–1.0)
Monocytes Relative: 9 % (ref 3.0–12.0)
Neutro Abs: 4 10*3/uL (ref 1.4–7.7)
Neutrophils Relative %: 56.3 % (ref 43.0–77.0)
Platelets: 261 10*3/uL (ref 150.0–400.0)
RBC: 4.95 Mil/uL (ref 3.87–5.11)
RDW: 14.1 % (ref 11.5–15.5)
WBC: 7.1 10*3/uL (ref 4.0–10.5)

## 2019-01-28 LAB — COMPREHENSIVE METABOLIC PANEL
ALT: 13 U/L (ref 0–35)
AST: 13 U/L (ref 0–37)
Albumin: 3.9 g/dL (ref 3.5–5.2)
Alkaline Phosphatase: 45 U/L (ref 39–117)
BUN: 13 mg/dL (ref 6–23)
CO2: 20 mEq/L (ref 19–32)
Calcium: 8.7 mg/dL (ref 8.4–10.5)
Chloride: 110 mEq/L (ref 96–112)
Creatinine, Ser: 0.73 mg/dL (ref 0.40–1.20)
GFR: 85.3 mL/min (ref >60.00)
Glucose, Bld: 88 mg/dL (ref 70–99)
Potassium: 3.8 mEq/L (ref 3.5–5.1)
Sodium: 140 mEq/L (ref 135–145)
Total Bilirubin: 0.4 mg/dL (ref 0.2–1.2)
Total Protein: 6.8 g/dL (ref 6.0–8.3)

## 2019-01-28 LAB — LIPID PANEL
Cholesterol: 147 mg/dL (ref 0–200)
HDL: 46.6 mg/dL (ref >39.00)
LDL Cholesterol: 89 mg/dL (ref 0–99)
NonHDL: 100.63
Total CHOL/HDL Ratio: 3
Triglycerides: 60 mg/dL (ref 0.0–149.0)
VLDL: 12 mg/dL (ref 0.0–40.0)

## 2019-01-28 LAB — TSH: TSH: 3.11 u[IU]/mL (ref 0.35–4.50)

## 2019-01-28 NOTE — Progress Notes (Signed)
[name], female   DOB: 05-31-1971, 48 y.o.   MRN: [id number]   Subjective:    [name], female    DOB: 05-31-1971, 48 y.o.   MRN: [id number]  HPI  Patient here for her physical exam.  She reports she is doing well.  Feels good.  Handling stress.  Trying to stay in due to covid restrictions.  No fever.  No cough, congestion or sob.  No chest pain.  No acid reflux.  No abdominal pain.  Bowels moving.  No urine change.  Has seen Dr Elesa MassedWard.  On LoEstrin.  History of menstrual migraines.  No significant problems with headaches now.  PAP 01/11/17 - negative with negative HPV.     Past Medical History:  Diagnosis Date  . Allergy   . Fainting spell   . Headache(784.0)   . Recurrent urinary tract infection    Past Surgical History:  Procedure Laterality Date  . CESAREAN SECTION  2008   Family History  Problem Relation Age of Onset  . Hyperlipidemia Mother   . Diabetes Maternal Aunt   . Diabetes Maternal Uncle   . Cancer Maternal Uncle        lung  . Cancer Paternal Aunt        uterus   Social History   Socioeconomic History  . Marital status: Married    Spouse name: Not on file  . Number of children: 2  . Years of education: Not on file  . Highest education level: Not on file  Occupational History    Employer: [employer]  Social Needs  . Financial resource strain: Not on file  . Food insecurity    Worry: Not on file    Inability: Not on file  . Transportation needs    Medical: Not on file    Non-medical: Not on file  Tobacco Use  . Smoking status: Never Smoker  . Smokeless tobacco: Never Used  Substance and Sexual Activity  . Alcohol use: Yes    Alcohol/week: 0.0 standard drinks  . Drug use: No  . Sexual activity: Not on file  Lifestyle  . Physical activity    Days per week: Not on file    Minutes per session: Not on file  . Stress: Not on file  Relationships  . Social Musicianconnections    Talks on phone: Not on file    Gets  together: Not on file    Attends religious service: Not on file    Active member of club or organization: Not on file    Attends meetings of clubs or organizations: Not on file    Relationship status: Not on file  Other Topics Concern  . Not on file  Social History Narrative  . Not on file    Outpatient Encounter Medications as of 01/28/2019  Medication Sig  . Ascorbic Acid (VITAMIN C PO) Take by mouth daily.  Marland Kitchen. BLISOVI 24 FE 1-20 MG-MCG(24) tablet Take 1 tablet by mouth daily.  . butalbital-acetaminophen-caffeine (FIORICET, ESGIC) 50-325-40 MG tablet Take 1-2 tablets by mouth as needed. 1-2 tablets at onset of headache then can repeat 1 tablet 6 hours later as needed.  . lactobacillus acidophilus (BACID) TABS tablet Take 2 tablets by mouth 3 (three) times daily.  Marland Kitchen. MAGNESIUM PO Take by mouth daily.  Marland Kitchen. MINIVELLE 0.075 MG/24HR APPLY ONE PATCH AT THE BEGINNING OF PERIOD AND THEN CHANGE AFTER 3 DAYS AS DIRECTED.  . mupirocin ointment (BACTROBAN) 2 %  Apply to affected area bid  . Probiotic Product (PROBIOTIC DAILY PO) Take by mouth daily.  Marland Kitchen topiramate (TOPAMAX) 50 MG tablet TAKE 1&1/2 TABLETS BY MOUTH TWICE A DAY  . topiramate (TOPAMAX) 50 MG tablet TAKE 1 & 1/2 TABLETS BY MOUTH 2 TIMES A DAY   No facility-administered encounter medications on file as of 01/28/2019.     Review of Systems  Constitutional: Negative for appetite change and unexpected weight change.  HENT: Negative for congestion and sinus pressure.   Eyes: Negative for pain and visual disturbance.  Respiratory: Negative for cough, chest tightness and shortness of breath.   Cardiovascular: Negative for chest pain, palpitations and leg swelling.  Gastrointestinal: Negative for abdominal pain, diarrhea, nausea and vomiting.  Genitourinary: Negative for difficulty urinating and dysuria.  Musculoskeletal: Negative for joint swelling and myalgias.  Skin: Negative for color change and rash.  Neurological: Negative for  dizziness, light-headedness and headaches.  Hematological: Negative for adenopathy. Does not bruise/bleed easily.  Psychiatric/Behavioral: Negative for agitation and dysphoric mood.       Objective:    Physical Exam Constitutional:      General: She is not in acute distress.    Appearance: Normal appearance. She is well-developed.  HENT:     Right Ear: External ear normal.     Left Ear: External ear normal.  Eyes:     General: No scleral icterus.       Right eye: No discharge.        Left eye: No discharge.     Conjunctiva/sclera: Conjunctivae normal.  Neck:     Musculoskeletal: Neck supple. No muscular tenderness.     Thyroid: No thyromegaly.  Cardiovascular:     Rate and Rhythm: Normal rate and regular rhythm.  Pulmonary:     Effort: No tachypnea, accessory muscle usage or respiratory distress.     Breath sounds: Normal breath sounds. No decreased breath sounds or wheezing.  Chest:     Breasts:        Right: No inverted nipple, mass, nipple discharge or tenderness (no axillary adenopathy).        Left: No inverted nipple, mass, nipple discharge or tenderness (no axilarry adenopathy).  Abdominal:     General: Bowel sounds are normal.     Palpations: Abdomen is soft.     Tenderness: There is no abdominal tenderness.  Genitourinary:    Comments: Normal external genitalia.  Vaginal vault without lesions.  Cervix identified.  Tissue protruding from cervical os. Pap smear performed.  Could not appreciate any adnexal masses or tenderness.   Musculoskeletal:        General: No swelling or tenderness.  Lymphadenopathy:     Cervical: No cervical adenopathy.  Skin:    Findings: No erythema or rash.  Neurological:     Mental Status: She is alert and oriented to person, place, and time.  Psychiatric:        Mood and Affect: Mood normal.        Behavior: Behavior normal.     BP 132/84   Pulse 95   Temp 98 F (36.7 C) (Oral)   Ht 5\' 3"  (1.6 m)   Wt 154 lb 12.8 oz (70.2 kg)    SpO2 93%   BMI 27.42 kg/m  Wt Readings from Last 3 Encounters:  01/28/19 154 lb 12.8 oz (70.2 kg)  01/22/18 149 lb 9.6 oz (67.9 kg)  07/17/17 149 lb 6.4 oz (67.8 kg)     Lab Results  Component  Value Date   WBC 7.1 01/28/2019   HGB 13.3 01/28/2019   HCT 41.1 01/28/2019   PLT 261.0 01/28/2019   GLUCOSE 88 01/28/2019   CHOL 147 01/28/2019   TRIG 60.0 01/28/2019   HDL 46.60 01/28/2019   LDLCALC 89 01/28/2019   ALT 13 01/28/2019   AST 13 01/28/2019   NA 140 01/28/2019   K 3.8 01/28/2019   CL 110 01/28/2019   CREATININE 0.73 01/28/2019   BUN 13 01/28/2019   CO2 20 01/28/2019   TSH 3.11 01/28/2019    Mm Diag Breast Tomo Uni Right  Result Date: 09/17/2018 CLINICAL DATA:  48 year old patient presents for follow-up of probably benign right breast calcifications. EXAM: DIGITAL DIAGNOSTIC UNILATERAL RIGHT MAMMOGRAM WITH CAD AND TOMO COMPARISON:  Previous exam(s). ACR Breast Density Category d: The breast tissue is extremely dense, which lowers the sensitivity of mammography. FINDINGS: No mass or architectural distortion in the right breast. Magnification views of the outer right breast show stable appearance of scattered faint predominantly round calcifications. No suspicious findings. Mammographic images were processed with CAD. IMPRESSION: Stable probably benign calcifications in the right breast. RECOMMENDATION: Bilateral diagnostic mammogram is recommended to complete a 1 year follow-up in August 2020. I have discussed the findings and recommendations with the patient. Results were also provided in writing at the conclusion of the visit. If applicable, a reminder letter will be sent to the patient regarding the next appointment. BI-RADS CATEGORY  3: Probably benign. Electronically Signed   By: Britta MccreedySusan  Turner M.D.   On: 09/17/2018 08:13       Assessment & Plan:   Problem List Items Addressed This Visit    Headache    Headaches under reasonable control.  Not a significant issue for  her now.  Follow.        Health care maintenance - Primary    Physical today 01/28/19.  PAP 01/28/19.  Tissue - protruding from cervical os.  Refer to gyn for evaluation.   Mammogram - due f/u bilateral diagnostic mammogram in 03/2019.  Schedule.        Relevant Orders   Cytology - PAP( [place]) (Completed)    Other Visit Diagnoses    Screening cholesterol level       Relevant Orders   CBC with Differential/Platelet (Completed)   Comprehensive metabolic panel (Completed)   Lipid panel (Completed)   TSH (Completed)   Cervix abnormality       Relevant Orders   Ambulatory referral to Gynecology       Dale Durhamharlene Billey Wojciak, MD

## 2019-01-28 NOTE — Progress Notes (Signed)
Pre visit review using our clinic review tool, if applicable. No additional management support is needed unless otherwise documented below in the visit note. 

## 2019-01-29 ENCOUNTER — Encounter: Payer: Self-pay | Admitting: Internal Medicine

## 2019-01-30 ENCOUNTER — Encounter: Payer: Self-pay | Admitting: Internal Medicine

## 2019-01-30 LAB — CYTOLOGY - PAP
Diagnosis: NEGATIVE
HPV: NOT DETECTED

## 2019-02-01 ENCOUNTER — Encounter: Payer: Self-pay | Admitting: Internal Medicine

## 2019-02-01 NOTE — Assessment & Plan Note (Signed)
Headaches under reasonable control.  Not a significant issue for her now.  Follow.

## 2019-02-01 NOTE — Assessment & Plan Note (Addendum)
Physical today 01/28/19.  PAP 01/28/19.  Tissue - protruding from cervical os.  Refer to gyn for evaluation.   Mammogram - due f/u bilateral diagnostic mammogram in 03/2019.  Schedule.

## 2019-02-21 ENCOUNTER — Other Ambulatory Visit: Payer: Self-pay | Admitting: Internal Medicine

## 2019-03-06 ENCOUNTER — Telehealth: Payer: Self-pay

## 2019-03-06 NOTE — Telephone Encounter (Signed)
Called [facility] to schedule, was advised that they are going to reach out to her to schedule so they can screen her.

## 2019-03-06 NOTE — Telephone Encounter (Addendum)
Regarding: schedule mammogram Needs mammogram scheduled.  Order has been placed.  Thanks   Dr Nicki Reaper

## 2019-05-24 ENCOUNTER — Other Ambulatory Visit: Payer: Self-pay | Admitting: Internal Medicine

## 2019-06-25 ENCOUNTER — Other Ambulatory Visit: Payer: Self-pay | Admitting: Internal Medicine

## 2019-06-25 DIAGNOSIS — R921 Mammographic calcification found on diagnostic imaging of breast: Secondary | ICD-10-CM

## 2019-07-22 ENCOUNTER — Other Ambulatory Visit: Payer: Self-pay

## 2019-07-22 ENCOUNTER — Ambulatory Visit
Admission: RE | Admit: 2019-07-22 | Discharge: 2019-07-22 | Disposition: A | Discharge status: Home / Self Care | Payer: Managed Care, Other (non HMO) | Source: Ambulatory Visit | Attending: Internal Medicine | Admitting: Internal Medicine

## 2019-07-22 DIAGNOSIS — R921 Mammographic calcification found on diagnostic imaging of breast: Secondary | ICD-10-CM

## 2019-07-23 ENCOUNTER — Other Ambulatory Visit: Payer: Self-pay | Admitting: Internal Medicine

## 2019-10-02 ENCOUNTER — Other Ambulatory Visit: Payer: Self-pay | Admitting: Internal Medicine

## 2020-01-06 ENCOUNTER — Other Ambulatory Visit: Payer: Self-pay | Admitting: Internal Medicine

## 2020-03-30 ENCOUNTER — Encounter: Payer: Managed Care, Other (non HMO) | Admitting: Internal Medicine

## 2020-04-09 ENCOUNTER — Other Ambulatory Visit: Payer: Self-pay | Admitting: Internal Medicine

## 2020-04-09 ENCOUNTER — Encounter: Payer: Self-pay | Admitting: Internal Medicine

## 2020-04-09 ENCOUNTER — Ambulatory Visit (INDEPENDENT_AMBULATORY_CARE_PROVIDER_SITE_OTHER): Payer: Managed Care, Other (non HMO) | Admitting: Internal Medicine

## 2020-04-09 ENCOUNTER — Other Ambulatory Visit: Payer: Self-pay

## 2020-04-09 VITALS — BP 102/64 | HR 85 | Temp 98.2°F | Ht 63.0 in | Wt 142.4 lb

## 2020-04-09 DIAGNOSIS — Z0000 Encounter for general adult medical examination without abnormal findings: Secondary | ICD-10-CM | POA: Diagnosis not present

## 2020-04-09 DIAGNOSIS — R519 Headache, unspecified: Secondary | ICD-10-CM

## 2020-04-09 NOTE — Progress Notes (Signed)
[name], female   DOB: Feb 19, 1971, 49 y.o.   MRN: [id number]   Subjective:    [name], female    DOB: Jul 31, 1971, 49 y.o.   MRN: [id number]  HPI This visit occurred during the SARS-CoV-2 public health emergency.  Safety protocols were in place, including screening questions prior to the visit, additional usage of staff PPE, and extensive cleaning of exam room while observing appropriate contact time as indicated for disinfecting solutions.  Patient here for her physical exam.  Sees gyn for breast and pelvic/pap smears. Trying to stay active.  No chest pain or sob.  No acid reflux.  No abdominal pain or bowel change.  Up to date withgyn visit.  Last evaluated 09/2019.  Handling stress.    Past Medical History:  Diagnosis Date   Allergy    Fainting spell    Headache(784.0)    Recurrent urinary tract infection    Past Surgical History:  Procedure Laterality Date   CESAREAN SECTION  2008   Family History  Problem Relation Age of Onset   Hyperlipidemia Mother    Diabetes Maternal Aunt    Diabetes Maternal Uncle    Cancer Maternal Uncle        lung   Cancer Paternal Aunt        uterus   Social History   Socioeconomic History   Marital status: Married    Spouse name: Not on file   Number of children: 2   Years of education: Not on file   Highest education level: Not on file  Occupational History    Employer: [employer].  Tobacco Use   Smoking status: Never Smoker   Smokeless tobacco: Never Used  Substance and Sexual Activity   Alcohol use: Yes    Alcohol/week: 0.0 standard drinks   Drug use: No   Sexual activity: Not on file  Other Topics Concern   Not on file  Social History Narrative   Not on file   Social Determinants of Health   Financial Resource Strain:    Difficulty of Paying Living Expenses: Not on file  Food Insecurity:    Worried About Running Out of Food in the Last Year: Not on file   Ran  Out of Food in the Last Year: Not on file  Transportation Needs:    Lack of Transportation (Medical): Not on file   Lack of Transportation (Non-Medical): Not on file  Physical Activity:    Days of Exercise per Week: Not on file   Minutes of Exercise per Session: Not on file  Stress:    Feeling of Stress : Not on file  Social Connections:    Frequency of Communication with Friends and Family: Not on file   Frequency of Social Gatherings with Friends and Family: Not on file   Attends Religious Services: Not on file   Active Member of Clubs or Organizations: Not on file   Attends Club or Organization Meetings: Not on file   Marital Status: Not on file    Outpatient Encounter Medications as of 04/09/2020  Medication Sig   Ascorbic Acid (VITAMIN C PO) Take by mouth daily.   BLISOVI 24 FE 1-20 MG-MCG(24) tablet Take 1 tablet by mouth daily.   lactobacillus acidophilus (BACID) TABS tablet Take 2 tablets by mouth 3 (three) times daily.   MAGNESIUM PO Take by mouth daily.   Probiotic Product (PROBIOTIC DAILY PO) Take by mouth daily.   topiramate (TOPAMAX) 50  MG tablet TAKE 1 & 1/2 TABLETS BY MOUTH 2 TIMES A DAY   [DISCONTINUED] topiramate (TOPAMAX) 50 MG tablet TAKE 1&1/2 TABLETS BY MOUTH TWICE A DAY   [DISCONTINUED] butalbital-acetaminophen-caffeine (FIORICET, ESGIC) 50-325-40 MG tablet Take 1-2 tablets by mouth as needed. 1-2 tablets at onset of headache then can repeat 1 tablet 6 hours later as needed. (Patient not taking: Reported on 04/09/2020)   [DISCONTINUED] MINIVELLE 0.075 MG/24HR APPLY ONE PATCH AT THE BEGINNING OF PERIOD AND THEN CHANGE AFTER 3 DAYS AS DIRECTED. (Patient not taking: Reported on 04/09/2020)   [DISCONTINUED] mupirocin ointment (BACTROBAN) 2 % Apply to affected area bid (Patient not taking: Reported on 04/09/2020)   No facility-administered encounter medications on file as of 04/09/2020.   Review of Systems  Constitutional: Negative for appetite change and  unexpected weight change.  HENT: Negative for congestion and sinus pressure.   Eyes: Negative for pain and visual disturbance.  Respiratory: Negative for cough, chest tightness and shortness of breath.   Cardiovascular: Negative for chest pain, palpitations and leg swelling.  Gastrointestinal: Negative for abdominal pain, diarrhea, nausea and vomiting.  Genitourinary: Negative for difficulty urinating and dysuria.  Musculoskeletal: Negative for joint swelling and myalgias.  Skin: Negative for color change and rash.  Neurological: Negative for dizziness, light-headedness and headaches.  Hematological: Negative for adenopathy. Does not bruise/bleed easily.  Psychiatric/Behavioral: Negative for agitation and dysphoric mood.       Objective:    Physical Exam Vitals reviewed.  Constitutional:      Appearance: Normal appearance.  HENT:     Nose: Nose normal.  Neck:     Thyroid: No thyromegaly.  Cardiovascular:     Rate and Rhythm: Normal rate and regular rhythm.  Pulmonary:     Effort: No respiratory distress.     Breath sounds: Normal breath sounds. No wheezing.  Abdominal:     General: Bowel sounds are normal.     Palpations: Abdomen is soft.     Tenderness: There is no abdominal tenderness.  Genitourinary:    Comments: Performed by gyn.  Musculoskeletal:        General: No swelling or tenderness.     Cervical back: Neck supple.  Lymphadenopathy:     Cervical: No cervical adenopathy.  Skin:    Findings: No erythema or rash.  Neurological:     Mental Status: She is alert.  Psychiatric:        Mood and Affect: Mood normal.        Behavior: Behavior normal.     BP 102/64 (BP Location: Left Arm, Patient Position: Sitting, Cuff Size: Normal)    Pulse 85    Temp 98.2 F (36.8 C)    Ht 5\' 3"  (1.6 m)    Wt 142 lb 6.4 oz (64.6 kg)    SpO2 99%    BMI 25.23 kg/m  Wt Readings from Last 3 Encounters:  04/09/20 142 lb 6.4 oz (64.6 kg)  01/28/19 154 lb 12.8 oz (70.2 kg)    01/22/18 149 lb 9.6 oz (67.9 kg)     Lab Results  Component Value Date   WBC 7.1 01/28/2019   HGB 13.3 01/28/2019   HCT 41.1 01/28/2019   PLT 261.0 01/28/2019   GLUCOSE 88 01/28/2019   CHOL 147 01/28/2019   TRIG 60.0 01/28/2019   HDL 46.60 01/28/2019   LDLCALC 89 01/28/2019   ALT 13 01/28/2019   AST 13 01/28/2019   NA 140 01/28/2019   K 3.8 01/28/2019   CL  110 01/28/2019   CREATININE 0.73 01/28/2019   BUN 13 01/28/2019   CO2 20 01/28/2019   TSH 3.11 01/28/2019    MM DIAG BREAST TOMO BILATERAL  Result Date: 07/22/2019 CLINICAL DATA:  Patient for short-term follow-up probably benign right breast calcifications. EXAM: DIGITAL DIAGNOSTIC BILATERAL MAMMOGRAM WITH CAD AND TOMO COMPARISON:  Previous exam(s). ACR Breast Density Category d: The breast tissue is extremely dense, which lowers the sensitivity of mammography. FINDINGS: Magnification CC and true lateral views of the right breast were obtained. Faint loosely grouped calcifications within the outer right breast are unchanged when compared to prior exams. No new masses, calcifications or distortion identified within either breast. Questioned asymmetry within the left breast resolved with additional imaging. Mammographic images were processed with CAD. IMPRESSION: Stable probably benign right breast calcifications. RECOMMENDATION: Bilateral diagnostic mammography with right breast magnification views in 12 months to reassess probably benign right breast calcifications. I have discussed the findings and recommendations with the patient. If applicable, a reminder letter will be sent to the patient regarding the next appointment. BI-RADS CATEGORY  3: Probably benign. Electronically Signed   By: Annia Belt M.D.   On: 07/22/2019 15:19       Assessment & Plan:   Problem List Items Addressed This Visit    Health care maintenance    Physical today.  PAP 01/28/20 - negative with negative HPV.  Mammogram 07/22/19 - briads III.         Headache    Stable on current regimen.  Follow.            Dale Harvey, MD

## 2020-04-19 ENCOUNTER — Encounter: Payer: Self-pay | Admitting: Internal Medicine

## 2020-04-19 NOTE — Assessment & Plan Note (Signed)
Stable on current regimen.  Follow.   

## 2020-04-19 NOTE — Assessment & Plan Note (Signed)
Physical today.  PAP 01/28/20 - negative with negative HPV.  Mammogram 07/22/19 - briads III.

## 2020-07-20 ENCOUNTER — Other Ambulatory Visit: Payer: Self-pay | Admitting: Internal Medicine

## 2020-08-13 ENCOUNTER — Other Ambulatory Visit: Payer: Self-pay | Admitting: Internal Medicine

## 2020-08-13 DIAGNOSIS — Z09 Encounter for follow-up examination after completed treatment for conditions other than malignant neoplasm: Secondary | ICD-10-CM

## 2020-09-03 ENCOUNTER — Emergency Department: Payer: Managed Care, Other (non HMO)

## 2020-09-03 ENCOUNTER — Other Ambulatory Visit: Payer: Self-pay

## 2020-09-03 ENCOUNTER — Emergency Department
Admission: EM | Admit: 2020-09-03 | Discharge: 2020-09-03 | Disposition: A | Discharge status: Home / Self Care | Payer: Managed Care, Other (non HMO) | Attending: Emergency Medicine | Admitting: Emergency Medicine

## 2020-09-03 DIAGNOSIS — N3001 Acute cystitis with hematuria: Secondary | ICD-10-CM | POA: Insufficient documentation

## 2020-09-03 DIAGNOSIS — R1031 Right lower quadrant pain: Secondary | ICD-10-CM | POA: Diagnosis present

## 2020-09-03 DIAGNOSIS — N200 Calculus of kidney: Secondary | ICD-10-CM

## 2020-09-03 DIAGNOSIS — R109 Unspecified abdominal pain: Secondary | ICD-10-CM

## 2020-09-03 DIAGNOSIS — N132 Hydronephrosis with renal and ureteral calculous obstruction: Secondary | ICD-10-CM | POA: Diagnosis not present

## 2020-09-03 DIAGNOSIS — R197 Diarrhea, unspecified: Secondary | ICD-10-CM | POA: Diagnosis not present

## 2020-09-03 DIAGNOSIS — R1084 Generalized abdominal pain: Secondary | ICD-10-CM

## 2020-09-03 LAB — URINALYSIS, COMPLETE (UACMP) WITH MICROSCOPIC
Bilirubin Urine: NEGATIVE
Glucose, UA: NEGATIVE mg/dL
Ketones, ur: 5 mg/dL — AB
Nitrite: NEGATIVE
Protein, ur: NEGATIVE mg/dL
RBC / HPF: >50 RBC/hpf — ABNORMAL HIGH (ref 0–5)
Specific Gravity, Urine: 1.021 (ref 1.005–1.030)
pH: 6 (ref 5.0–8.0)

## 2020-09-03 LAB — COMPREHENSIVE METABOLIC PANEL
ALT: 16 U/L (ref 0–44)
AST: 18 U/L (ref 15–41)
Albumin: 3.8 g/dL (ref 3.5–5.0)
Alkaline Phosphatase: 32 U/L — ABNORMAL LOW (ref 38–126)
Anion gap: 8 (ref 5–15)
BUN: 19 mg/dL (ref 6–20)
CO2: 21 mmol/L — ABNORMAL LOW (ref 22–32)
Calcium: 8.9 mg/dL (ref 8.9–10.3)
Chloride: 109 mmol/L (ref 98–111)
Creatinine, Ser: 0.73 mg/dL (ref 0.44–1.00)
GFR, Estimated: >60 mL/min (ref >60)
Glucose, Bld: 123 mg/dL — ABNORMAL HIGH (ref 70–99)
Potassium: 4 mmol/L (ref 3.5–5.1)
Sodium: 138 mmol/L (ref 135–145)
Total Bilirubin: 0.7 mg/dL (ref 0.3–1.2)
Total Protein: 7.2 g/dL (ref 6.5–8.1)

## 2020-09-03 LAB — CBC WITH DIFFERENTIAL/PLATELET
Abs Immature Granulocytes: 0.04 10*3/uL (ref 0.00–0.07)
Basophils Absolute: 0.1 10*3/uL (ref 0.0–0.1)
Basophils Relative: 1 %
Eosinophils Absolute: 0 10*3/uL (ref 0.0–0.5)
Eosinophils Relative: 0 %
HCT: 40.9 % (ref 36.0–46.0)
Hemoglobin: 13.6 g/dL (ref 12.0–15.0)
Immature Granulocytes: 0 %
Lymphocytes Relative: 12 %
Lymphs Abs: 1.2 10*3/uL (ref 0.7–4.0)
MCH: 27.3 pg (ref 26.0–34.0)
MCHC: 33.3 g/dL (ref 30.0–36.0)
MCV: 82 fL (ref 80.0–100.0)
Monocytes Absolute: 0.4 10*3/uL (ref 0.1–1.0)
Monocytes Relative: 4 %
Neutro Abs: 8.3 10*3/uL — ABNORMAL HIGH (ref 1.7–7.7)
Neutrophils Relative %: 83 %
Platelets: 313 10*3/uL (ref 150–400)
RBC: 4.99 MIL/uL (ref 3.87–5.11)
RDW: 14.1 % (ref 11.5–15.5)
WBC: 10 10*3/uL (ref 4.0–10.5)
nRBC: 0 % (ref 0.0–0.2)

## 2020-09-03 LAB — PREGNANCY, URINE: Preg Test, Ur: NEGATIVE

## 2020-09-03 LAB — LIPASE, BLOOD: Lipase: 44 U/L (ref 11–51)

## 2020-09-03 LAB — POC URINE PREG, ED: Preg Test, Ur: NEGATIVE

## 2020-09-03 MED ORDER — ACETAMINOPHEN 500 MG PO TABS
1000.0000 mg | ORAL_TABLET | Freq: Once | ORAL | Status: AC
  Administered 2020-09-03: 1000 mg via ORAL
  Filled 2020-09-03: qty 2

## 2020-09-03 MED ORDER — OXYCODONE-ACETAMINOPHEN 5-325 MG PO TABS: 1.0000 | ORAL_TABLET | Freq: Three times a day (TID) | ORAL | 0 refills | Status: AC | PRN

## 2020-09-03 MED ORDER — HYDROMORPHONE HCL 1 MG/ML IJ SOLN
0.5000 mg | Freq: Once | INTRAMUSCULAR | Status: AC
Start: 2020-09-03 — End: 2020-09-03
  Administered 2020-09-03: 0.5 mg via INTRAVENOUS
  Filled 2020-09-03: qty 1

## 2020-09-03 MED ORDER — ONDANSETRON HCL 4 MG PO TABS: 4.0000 mg | ORAL_TABLET | Freq: Three times a day (TID) | ORAL | 0 refills | Status: DC | PRN

## 2020-09-03 MED ORDER — ONDANSETRON HCL 4 MG/2ML IJ SOLN
4.0000 mg | Freq: Once | INTRAMUSCULAR | Status: AC
  Administered 2020-09-03: 4 mg via INTRAVENOUS
  Filled 2020-09-03: qty 2

## 2020-09-03 MED ORDER — KETOROLAC TROMETHAMINE 30 MG/ML IJ SOLN
15.0000 mg | Freq: Once | INTRAMUSCULAR | Status: AC
  Administered 2020-09-03: 15 mg via INTRAVENOUS
  Filled 2020-09-03: qty 1

## 2020-09-03 MED ORDER — CIPROFLOXACIN HCL 500 MG PO TABS: 500.0000 mg | ORAL_TABLET | Freq: Two times a day (BID) | ORAL | 0 refills | Status: AC

## 2020-09-03 MED ORDER — CIPROFLOXACIN HCL 500 MG PO TABS: 500.0000 mg | ORAL_TABLET | Freq: Two times a day (BID) | ORAL | 0 refills | Status: DC

## 2020-09-03 MED ORDER — SODIUM CHLORIDE 0.9 % IV SOLN
1.0000 g | Freq: Once | INTRAVENOUS | Status: AC
  Administered 2020-09-03: 1 g via INTRAVENOUS
  Filled 2020-09-03: qty 10

## 2020-09-03 MED ORDER — LACTATED RINGERS IV BOLUS
1000.0000 mL | Freq: Once | INTRAVENOUS | Status: AC
  Administered 2020-09-03: 1000 mL via INTRAVENOUS

## 2020-09-03 NOTE — Discharge Instructions (Addendum)
I would take 500 mg of Tylenol and 400 mg of ibuprofen every 6 hours in addition to your Percocet as needed.  Please return immediately to emergency room if you experience any fevers, worsening of your pain or or you're unable to keep down fluids.  Your [place] today showed: Negative for gallstones or cholecystitis. No acute finding by ultrasound.   1.9 cm left hepatic lobe subcapsular hyperechoic lesion, favored to be hemangioma but remains indeterminate by ultrasound. No available comparison studies. Recommend nonemergent follow-up MRI without and with contrast for further evaluation. Nonobstructing right nephrolithiass.  Your CT today showed: 1. Bilateral nephrolithiasis. Mild right hydronephrosis is noted which appears to be due to 5 mm calculus at the right ureteropelvic junction. 2. Enlarged fibroid uterus.

## 2020-09-03 NOTE — ED Triage Notes (Addendum)
Pt comes via POV from home with c/o abdominal pain that started around 0300 this morning. Pt states some dizziness and nausea. Pt also states diarrhea. Pt states pain radiates to back on right side. Pt about 6 at this time. Pt took medication with some relief.  Pt denies any CP or SOB.  MD at bedside

## 2020-09-03 NOTE — ED Provider Notes (Signed)
[facility] Emergency Department Provider Note  ____________________________________________   Event Date/Time   First MD Initiated Contact with Patient 09/03/20 [phone]     (approximate)  I have reviewed the triage vital signs and the nursing notes.   HISTORY  Chief Complaint Abdominal Pain   HPI Cassandra Matthews is a 50 y.o. female with past medical history of headaches who presents for assessment of some right lower quadrant pain rating to her back that began last night.  No prior similar symptoms.  No alleviating or aggravating factors.  This was associate with some nausea and nonbloody diarrhea.  Patient denies any headache, earache, sore throat, chest pain, cough, shortness of breath, burning with urination, blood in urine, abnormal vaginal bleeding or discharge, constipation, rash or extremity pain.  No recent falls or injuries.  She states she felt well last night.  She is not a regular alcohol drinker does not smoke or use illegal drugs.  No other acute concerns at this time.         Past Medical History:  Diagnosis Date  . Allergy   . Fainting spell   . Headache(784.0)   . Recurrent urinary tract infection     Patient Active Problem List   Diagnosis Date Noted  . Heavy menses 01/11/2016  . Health care maintenance 01/11/2015  . Pelvic pain in female 01/11/2015  . Recurrent urinary tract infection 12/02/2012  . Urinary tract infection 12/02/2012  . Headache 11/30/2012  . Chronic cystitis 09/06/2012    Past Surgical History:  Procedure Laterality Date  . CESAREAN SECTION  2008    Prior to Admission medications   Medication Sig Start Date End Date Taking? Authorizing Provider  Ascorbic Acid (VITAMIN C PO) Take by mouth daily.   Yes [provider]  ciprofloxacin (CIPRO) 500 MG tablet Take 1 tablet (500 mg total) by mouth 2 (two) times daily for 7 days. 09/03/20 09/10/20 Yes Gilles Chiquito, MD  Norethin Ace-Eth Estrad-FE (JUNEL FE  1.5/30 PO) Take 1 tablet by mouth daily.   Yes [provider]  ondansetron (ZOFRAN) 4 MG tablet Take 1 tablet (4 mg total) by mouth every 8 (eight) hours as needed for up to 10 doses for nausea or vomiting. 09/03/20  Yes Gilles Chiquito, MD  oxyCODONE-acetaminophen (PERCOCET) 5-325 MG tablet Take 1 tablet by mouth every 8 (eight) hours as needed for up to 5 days for severe pain. 09/03/20 09/08/20 Yes Gilles Chiquito, MD  topiramate (TOPAMAX) 50 MG tablet TAKE 1 & 1/2 TABLETS BY MOUTH 2 TIMES A DAY Patient taking differently: Take 150 mg by mouth daily. 07/20/20  Yes Dale Nason, MD    Allergies Sulfa antibiotics  Family History  Problem Relation Age of Onset  . Hyperlipidemia Mother   . Diabetes Maternal Aunt   . Diabetes Maternal Uncle   . Cancer Maternal Uncle        lung  . Cancer Paternal Aunt        uterus    Social History Social History   Tobacco Use  . Smoking status: Never Smoker  . Smokeless tobacco: Never Used  Substance Use Topics  . Alcohol use: Yes    Alcohol/week: 0.0 standard drinks  . Drug use: No    Review of Systems  Review of Systems  Constitutional: Negative for chills and fever.  HENT: Negative for sore throat.   Eyes: Negative for pain.  Respiratory: Negative for cough and stridor.   Cardiovascular: Negative  for chest pain.  Gastrointestinal: Positive for abdominal pain and nausea. Negative for vomiting.  Musculoskeletal: Positive for back pain ( lower back). Negative for myalgias.  Skin: Negative for rash.  Neurological: Negative for seizures, loss of consciousness and headaches.  Psychiatric/Behavioral: Negative for suicidal ideas.  All other systems reviewed and are negative.     ____________________________________________   PHYSICAL EXAM:  VITAL SIGNS: ED Triage Vitals  Enc Vitals Group     BP      Pulse      Resp      Temp      Temp src      SpO2      Weight      Height      Head Circumference      Peak Flow       Pain Score      Pain Loc      Pain Edu?      Excl. in GC?    Vitals:   09/03/20 0800 09/03/20 0830  BP: 101/66 102/69  Pulse: 79 72  Resp:  16  Temp:    SpO2: 97% 97%   Physical Exam Vitals and nursing note reviewed.  Constitutional:      General: She is not in acute distress.    Appearance: She is well-developed and well-nourished.  HENT:     Head: Normocephalic and atraumatic.  Eyes:     Conjunctiva/sclera: Conjunctivae normal.  Cardiovascular:     Rate and Rhythm: Normal rate and regular rhythm.     Heart sounds: No murmur heard.   Pulmonary:     Effort: Pulmonary effort is normal. No respiratory distress.     Breath sounds: Normal breath sounds.  Abdominal:     Palpations: Abdomen is soft.     Tenderness: There is abdominal tenderness in the right upper quadrant. There is no right CVA tenderness or left CVA tenderness.  Musculoskeletal:        General: No edema.     Cervical back: Neck supple.  Skin:    General: Skin is warm and dry.     Capillary Refill: Capillary refill takes less than 2 seconds.  Neurological:     General: No focal deficit present.     Mental Status: She is alert.  Psychiatric:        Mood and Affect: Mood and affect and mood normal.     ____________________________________________   LABS (all labs ordered are listed, but only abnormal results are displayed)  Labs Reviewed  CBC WITH DIFFERENTIAL/PLATELET - Abnormal; Notable for the following components:      Result Value   Neutro Abs 8.3 (*)    All other components within normal limits  COMPREHENSIVE METABOLIC PANEL - Abnormal; Notable for the following components:   CO2 21 (*)    Glucose, Bld 123 (*)    Alkaline Phosphatase 32 (*)    All other components within normal limits  URINALYSIS, COMPLETE (UACMP) WITH MICROSCOPIC - Abnormal; Notable for the following components:   Color, Urine YELLOW (*)    APPearance HAZY (*)    Hgb urine dipstick LARGE (*)    Ketones, ur 5 (*)     Leukocytes,Ua SMALL (*)    RBC / HPF >50 (*)    Bacteria, UA RARE (*)    All other components within normal limits  URINE CULTURE  LIPASE, BLOOD  PREGNANCY, URINE  POC URINE PREG, ED   ____________________________________________  ____________________________________________   PROCEDURES  Procedure(s) performed (  including Critical Care):  .1-3 Lead EKG Interpretation Performed by: Gilles Chiquito, MD Authorized by: Gilles Chiquito, MD     Interpretation: normal     ECG rate assessment: normal     Rhythm: sinus rhythm     Ectopy: none     Conduction: normal       ____________________________________________   INITIAL IMPRESSION / ASSESSMENT AND PLAN / ED COURSE      Patient presents with facial exam for assessment of some nausea associated with right lower quadrant pain rating to her right back.  She is afebrile hemodynamically stable arrival.  Primary differential includes but is not limited to cholecystitis, pancreatitis, appendicitis, torsion, kidney stone, cystitis and pyelonephritis.  Patient is nonperitoneal recommend a low suspicion for perforated viscus.  She denies any vaginal bleeding or discharge and her pain does not seem pelvic and lower suspicion for PID at this time.  No history of constipation or vomiting suspicion for obstruction at this time.  CBC is unremarkable and shows no leukocytosis or acute anemia.  CMP with no significant electrolyte or metabolic derangements.  No evidence of cholestasis or hepatitis.  Lipase of 44 not consistent with acute pancreatitis.  Right upper quadrant ultrasound shows no evidence of acute cholecystitis stone or other clear acute upper quadrant pathology.  Patient is noted to have possible hemangioma and nonobstructing right-sided kidney stone.  CT obtained remarkable for evidence of a right 5 mm kidney stone with some mild hydronephrosis.  There is also a small stone on the left although suspect is incidental.  No  evidence of cholecystitis, appendicitis, diverticulitis or other acute intra-abdominal pelvic pathology.  Given UA is concerning for possible infection patient was given dose of Rocephin in the ED.  She was given IV fluids and blood noted analgesia and antiemetic.  On my reassessment she stated she felt much better.  Given normal kidney function with no fever or elevated white blood cell count have low suspicion for sepsis or other immediate life-threatening pathology.  Believe patient safe for discharge plan for outpatient follow-up with prescription for antibiotic, antiemetic, and analgesia.  Discussed above-noted incidental findings including right upper quadrant ultrasound findings with recommendation for outpatient imaging.  Patient discharged condition.  Strict return cautions advised and discussed.   ____________________________________________   FINAL CLINICAL IMPRESSION(S) / ED DIAGNOSES  Final diagnoses:  Abdominal pain  Generalized abdominal pain  Acute cystitis with hematuria  Kidney stone    Medications  ketorolac (TORADOL) 30 MG/ML injection 15 mg (has no administration in time range)  ondansetron (ZOFRAN) injection 4 mg (4 mg Intravenous Given 09/03/20 0735)  lactated ringers bolus 1,000 mL (0 mLs Intravenous Stopped 09/03/20 0826)  HYDROmorphone (DILAUDID) injection 0.5 mg (0.5 mg Intravenous Given 09/03/20 0735)  cefTRIAXone (ROCEPHIN) 1 g in sodium chloride 0.9 % 100 mL IVPB (1 g Intravenous New Bag/Given 09/03/20 0836)  acetaminophen (TYLENOL) tablet 1,000 mg (1,000 mg Oral Given 09/03/20 0848)     ED Discharge Orders         Ordered    ciprofloxacin (CIPRO) 500 MG tablet  2 times daily        09/03/20 0941    oxyCODONE-acetaminophen (PERCOCET) 5-325 MG tablet  Every 8 hours PRN        09/03/20 0941    ondansetron (ZOFRAN) 4 MG tablet  Every 8 hours PRN        09/03/20 0941           Note:  This document was  prepared using Conservation officer, historic buildings and may  include unintentional dictation errors.   Gilles Chiquito, MD 09/03/20 1004

## 2020-09-03 NOTE — ED Notes (Signed)
Mother is at the bedside.  Pt ambulates to the bathroom to void for urine sample for POC preg.

## 2020-09-04 LAB — URINE CULTURE: Culture: NO GROWTH

## 2020-09-23 ENCOUNTER — Other Ambulatory Visit: Payer: Self-pay | Admitting: Internal Medicine

## 2020-09-23 DIAGNOSIS — R921 Mammographic calcification found on diagnostic imaging of breast: Secondary | ICD-10-CM

## 2020-09-28 ENCOUNTER — Ambulatory Visit
Admission: RE | Admit: 2020-09-28 | Discharge: 2020-09-28 | Disposition: A | Discharge status: Home / Self Care | Payer: Managed Care, Other (non HMO) | Source: Ambulatory Visit | Attending: Internal Medicine | Admitting: Internal Medicine

## 2020-09-28 ENCOUNTER — Other Ambulatory Visit: Payer: Self-pay

## 2020-09-28 DIAGNOSIS — R921 Mammographic calcification found on diagnostic imaging of breast: Secondary | ICD-10-CM

## 2020-11-23 ENCOUNTER — Other Ambulatory Visit: Payer: Self-pay | Admitting: Internal Medicine

## 2021-02-28 ENCOUNTER — Other Ambulatory Visit: Payer: Self-pay | Admitting: Internal Medicine

## 2021-04-19 ENCOUNTER — Encounter: Payer: Managed Care, Other (non HMO) | Admitting: Internal Medicine

## 2021-05-07 ENCOUNTER — Encounter: Payer: Managed Care, Other (non HMO) | Admitting: Internal Medicine

## 2021-05-20 ENCOUNTER — Encounter: Payer: Self-pay | Admitting: Internal Medicine

## 2021-05-20 ENCOUNTER — Other Ambulatory Visit: Payer: Self-pay

## 2021-05-20 ENCOUNTER — Ambulatory Visit (INDEPENDENT_AMBULATORY_CARE_PROVIDER_SITE_OTHER): Payer: Managed Care, Other (non HMO) | Admitting: Internal Medicine

## 2021-05-20 VITALS — BP 120/82 | HR 94 | Temp 97.0°F | Ht 62.99 in | Wt 148.4 lb

## 2021-05-20 DIAGNOSIS — Z23 Encounter for immunization: Secondary | ICD-10-CM

## 2021-05-20 DIAGNOSIS — E538 Deficiency of other specified B group vitamins: Secondary | ICD-10-CM

## 2021-05-20 DIAGNOSIS — Z1211 Encounter for screening for malignant neoplasm of colon: Secondary | ICD-10-CM

## 2021-05-20 DIAGNOSIS — Z87442 Personal history of urinary calculi: Secondary | ICD-10-CM | POA: Diagnosis not present

## 2021-05-20 DIAGNOSIS — Z0000 Encounter for general adult medical examination without abnormal findings: Secondary | ICD-10-CM

## 2021-05-20 DIAGNOSIS — Z13220 Encounter for screening for lipoid disorders: Secondary | ICD-10-CM | POA: Diagnosis not present

## 2021-05-20 DIAGNOSIS — N1330 Unspecified hydronephrosis: Secondary | ICD-10-CM

## 2021-05-20 NOTE — Assessment & Plan Note (Addendum)
Physical today 05/20/21. PAP 01/28/20 - negative with negative HPV.  Mammogram 09/28/20 - Birads II. Discussed colonoscopy.  Agreeable for referral.

## 2021-05-20 NOTE — Progress Notes (Signed)
[name], female   DOB: February 03, 1971, 50 y.o.   MRN: [id number]   Subjective:    [name], female    DOB: Dec 29, 1970, 50 y.o.   MRN: [id number]  This visit occurred during the SARS-CoV-2 public health emergency.  Safety protocols were in place, including screening questions prior to the visit, additional usage of staff PPE, and extensive cleaning of exam room while observing appropriate contact time as indicated for disinfecting solutions.   Patient here for her physical exam.   Chief Complaint  Patient presents with   Annual Exam   .   HPI Was een in ER 09/2020 - right lower quadrant pain - radiating to back.  CT - 76mm kidney stone with some mild hydronephrosis. (Also small stone on left).  Has done ok since ER visit.  Does report some left low back pin - intermittent.  Has been drinking lemon water.  Discussed f/u with urology given CT findings.  Was Dr Elesa Massed - 09/22/20 - f/u GYN.  Had received B12 injection.  Was questioning B12 level now.  Continues on Junel Fe.  Breathing stable.  No sob reported.  No cough or congestion.     Past Medical History:  Diagnosis Date   Allergy    Fainting spell    Headache(784.0)    Recurrent urinary tract infection    Past Surgical History:  Procedure Laterality Date   CESAREAN SECTION  2008   Family History  Problem Relation Age of Onset   Hyperlipidemia Mother    Diabetes Maternal Aunt    Diabetes Maternal Uncle    Cancer Maternal Uncle        lung   Cancer Paternal Aunt        uterus   Social History   Socioeconomic History   Marital status: Married    Spouse name: Not on file   Number of children: 2   Years of education: Not on file   Highest education level: Not on file  Occupational History    Employer: [employer].  Tobacco Use   Smoking status: Never   Smokeless tobacco: Never  Substance and Sexual Activity   Alcohol use: Yes    Alcohol/week: 0.0 standard drinks   Drug use: No   Sexual  activity: Not on file  Other Topics Concern   Not on file  Social History Narrative   Not on file   Social Determinants of Health   Financial Resource Strain: Not on file  Food Insecurity: Not on file  Transportation Needs: Not on file  Physical Activity: Not on file  Stress: Not on file  Social Connections: Not on file     Review of Systems  Constitutional:  Negative for appetite change and unexpected weight change.  HENT:  Negative for congestion, sinus pressure and sore throat.   Eyes:  Negative for pain and visual disturbance.  Respiratory:  Negative for cough, chest tightness and shortness of breath.   Cardiovascular:  Negative for chest pain, palpitations and leg swelling.  Gastrointestinal:  Negative for abdominal pain, diarrhea, nausea and vomiting.  Genitourinary:  Negative for difficulty urinating and dysuria.  Musculoskeletal:  Negative for joint swelling and myalgias.  Skin:  Negative for color change and rash.  Neurological:  Negative for dizziness, light-headedness and headaches.  Hematological:  Negative for adenopathy. Does not bruise/bleed easily.  Psychiatric/Behavioral:  Negative for agitation and dysphoric mood.       Objective:  BP 120/82   Pulse 94   Temp (!) 97 F (36.1 C)   Ht 5' 2.99" (1.6 m)   Wt 148 lb 6.4 oz (67.3 kg)   SpO2 98%   BMI 26.29 kg/m  Wt Readings from Last 3 Encounters:  05/20/21 148 lb 6.4 oz (67.3 kg)  09/03/20 138 lb (62.6 kg)  04/09/20 142 lb 6.4 oz (64.6 kg)    Physical Exam Vitals reviewed.  Constitutional:      General: She is not in acute distress.    Appearance: Normal appearance. She is well-developed.  HENT:     Head: Normocephalic and atraumatic.     Right Ear: External ear normal.     Left Ear: External ear normal.  Eyes:     General: No scleral icterus.       Right eye: No discharge.        Left eye: No discharge.     Conjunctiva/sclera: Conjunctivae normal.  Neck:     Thyroid: No thyromegaly.   Cardiovascular:     Rate and Rhythm: Normal rate and regular rhythm.  Pulmonary:     Effort: No tachypnea, accessory muscle usage or respiratory distress.     Breath sounds: Normal breath sounds. No decreased breath sounds or wheezing.  Chest:  Breasts:    Right: No inverted nipple, mass, nipple discharge or tenderness (no axillary adenopathy).     Left: No inverted nipple, mass, nipple discharge or tenderness (no axilarry adenopathy).  Abdominal:     General: Bowel sounds are normal.     Palpations: Abdomen is soft.     Tenderness: There is no abdominal tenderness.  Musculoskeletal:        General: No swelling or tenderness.     Cervical back: Neck supple.  Lymphadenopathy:     Cervical: No cervical adenopathy.  Skin:    Findings: No erythema or rash.  Neurological:     Mental Status: She is alert and oriented to person, place, and time.  Psychiatric:        Mood and Affect: Mood normal.        Behavior: Behavior normal.     Outpatient Encounter Medications as of 05/20/2021  Medication Sig   Norethin Ace-Eth Estrad-FE (JUNEL FE 1.5/30 PO) Take 1 tablet by mouth daily.   topiramate (TOPAMAX) 50 MG tablet TAKE 1 & 1/2 TABLETS BY MOUTH 2 TIMES A DAY   [DISCONTINUED] Ascorbic Acid (VITAMIN C PO) Take by mouth daily. (Patient not taking: Reported on 05/20/2021)   [DISCONTINUED] ondansetron (ZOFRAN) 4 MG tablet Take 1 tablet (4 mg total) by mouth every 8 (eight) hours as needed for up to 10 doses for nausea or vomiting. (Patient not taking: Reported on 05/20/2021)   No facility-administered encounter medications on file as of 05/20/2021.     Lab Results  Component Value Date   WBC 8.4 05/20/2021   HGB 13.1 05/20/2021   HCT 40.3 05/20/2021   PLT 279.0 05/20/2021   GLUCOSE 78 05/20/2021   CHOL 139 05/20/2021   TRIG 120.0 05/20/2021   HDL 42.10 05/20/2021   LDLCALC 73 05/20/2021   ALT 8 05/20/2021   AST 11 05/20/2021   NA 140 05/20/2021   K 3.8 05/20/2021   CL 108  05/20/2021   CREATININE 0.61 05/20/2021   BUN 15 05/20/2021   CO2 24 05/20/2021   TSH 1.77 05/20/2021    MM DIAG BREAST TOMO BILATERAL  Result Date: 09/28/2020 CLINICAL DATA:  Follow-up right breast probably benign calcifications.  EXAM: DIGITAL DIAGNOSTIC BILATERAL MAMMOGRAM WITH TOMOSYNTHESIS AND CAD TECHNIQUE: Bilateral digital diagnostic mammography and breast tomosynthesis was performed. The images were evaluated with computer-aided detection. COMPARISON:  Previous exam(s). ACR Breast Density Category d: The breast tissue is extremely dense, which lowers the sensitivity of mammography. FINDINGS: The previously demonstrated probably benign calcifications in the outer right breast are unchanged. These consist of minimal, scattered, punctate calcifications with no grouped calcifications. No interval findings suspicious for malignancy in either breast. IMPRESSION: 1. No progression of the previously described probably benign calcifications in the outer right breast over the past 2.5 years. These are compatible with benign calcifications. 2. No evidence of malignancy in either breast. RECOMMENDATION: Bilateral screening mammogram in 1 year. I have discussed the findings and recommendations with the patient. If applicable, a reminder letter will be sent to the patient regarding the next appointment. BI-RADS CATEGORY  2: Benign. Electronically Signed   By: Beckie Salts M.D.   On: 09/28/2020 17:21       Assessment & Plan:   Problem List Items Addressed This Visit     B12 deficiency    Documented through gyn.  Recheck B12 level today.       Relevant Orders   TSH (Completed)   Vitamin B12 (Completed)   Health care maintenance    Physical today 05/20/21. PAP 01/28/20 - negative with negative HPV.  Mammogram 09/28/20 - Birads II. Discussed colonoscopy.  Agreeable for referral.       History of kidney stones    Recently found to have kidney stones on CT.  See report.  (left with mild  hydronephrosis, also noted right kidney stone).  Discussed referral to urology.  Agreeable.       Relevant Orders   CBC with Differential/Platelet (Completed)   Comprehensive metabolic panel (Completed)   Ambulatory referral to Urology   Hydronephrosis    Mild hydronephrosis noted on CT.  Refer to urology.  Check renal function today.       Relevant Orders   Ambulatory referral to Urology   Other Visit Diagnoses     Routine general medical examination at a health care facility    -  Primary   Screening cholesterol level       Relevant Orders   Lipid panel (Completed)   Need for immunization against influenza       Relevant Orders   Flu Vaccine QUAD 15mo+IM (Fluarix, Fluzone & Alfiuria Quad PF) (Completed)   Colon cancer screening       Relevant Orders   Ambulatory referral to Gastroenterology        Dale Breckenridge, MD

## 2021-05-21 LAB — COMPREHENSIVE METABOLIC PANEL
ALT: 8 U/L (ref 0–35)
AST: 11 U/L (ref 0–37)
Albumin: 3.9 g/dL (ref 3.5–5.2)
Alkaline Phosphatase: 38 U/L — ABNORMAL LOW (ref 39–117)
BUN: 15 mg/dL (ref 6–23)
CO2: 24 mEq/L (ref 19–32)
Calcium: 9.1 mg/dL (ref 8.4–10.5)
Chloride: 108 mEq/L (ref 96–112)
Creatinine, Ser: 0.61 mg/dL (ref 0.40–1.20)
GFR: 104.74 mL/min (ref >60.00)
Glucose, Bld: 78 mg/dL (ref 70–99)
Potassium: 3.8 mEq/L (ref 3.5–5.1)
Sodium: 140 mEq/L (ref 135–145)
Total Bilirubin: 0.3 mg/dL (ref 0.2–1.2)
Total Protein: 6.5 g/dL (ref 6.0–8.3)

## 2021-05-21 LAB — CBC WITH DIFFERENTIAL/PLATELET
Basophils Absolute: 0.1 10*3/uL (ref 0.0–0.1)
Basophils Relative: 0.8 % (ref 0.0–3.0)
Eosinophils Absolute: 0.2 10*3/uL (ref 0.0–0.7)
Eosinophils Relative: 2.4 % (ref 0.0–5.0)
HCT: 40.3 % (ref 36.0–46.0)
Hemoglobin: 13.1 g/dL (ref 12.0–15.0)
Lymphocytes Relative: 41.9 % (ref 12.0–46.0)
Lymphs Abs: 3.5 10*3/uL (ref 0.7–4.0)
MCHC: 32.5 g/dL (ref 30.0–36.0)
MCV: 83 fl (ref 78.0–100.0)
Monocytes Absolute: 0.6 10*3/uL (ref 0.1–1.0)
Monocytes Relative: 7.8 % (ref 3.0–12.0)
Neutro Abs: 3.9 10*3/uL (ref 1.4–7.7)
Neutrophils Relative %: 47.1 % (ref 43.0–77.0)
Platelets: 279 10*3/uL (ref 150.0–400.0)
RBC: 4.85 Mil/uL (ref 3.87–5.11)
RDW: 13.8 % (ref 11.5–15.5)
WBC: 8.4 10*3/uL (ref 4.0–10.5)

## 2021-05-21 LAB — LIPID PANEL
Cholesterol: 139 mg/dL (ref 0–200)
HDL: 42.1 mg/dL (ref >39.00)
LDL Cholesterol: 73 mg/dL (ref 0–99)
NonHDL: 96.68
Total CHOL/HDL Ratio: 3
Triglycerides: 120 mg/dL (ref 0.0–149.0)
VLDL: 24 mg/dL (ref 0.0–40.0)

## 2021-05-21 LAB — TSH: TSH: 1.77 u[IU]/mL (ref 0.35–5.50)

## 2021-05-21 LAB — VITAMIN B12: Vitamin B-12: 210 pg/mL — ABNORMAL LOW (ref 211–911)

## 2021-05-22 ENCOUNTER — Encounter: Payer: Self-pay | Admitting: Internal Medicine

## 2021-05-22 DIAGNOSIS — N1330 Unspecified hydronephrosis: Secondary | ICD-10-CM | POA: Insufficient documentation

## 2021-05-22 NOTE — Assessment & Plan Note (Signed)
Recently found to have kidney stones on CT.  See report.  (left with mild hydronephrosis, also noted right kidney stone).  Discussed referral to urology.  Agreeable.

## 2021-05-22 NOTE — Assessment & Plan Note (Addendum)
Mild hydronephrosis noted on CT.  Refer to urology.  Check renal function today.

## 2021-05-22 NOTE — Assessment & Plan Note (Signed)
Documented through gyn.  Recheck B12 level today.

## 2021-05-26 MED ORDER — "BD LUER-LOK SYRINGE 25G X 1"" 3 ML MISC": 1 refills | Status: DC

## 2021-05-26 MED ORDER — CYANOCOBALAMIN 1000 MCG/ML IJ SOLN: INTRAMUSCULAR | 1 refills | Status: DC

## 2021-05-26 NOTE — Addendum Note (Signed)
Addended by: Elise Benne T on: 05/26/2021 09:37 AM   Modules accepted: Orders

## 2021-06-10 ENCOUNTER — Other Ambulatory Visit: Payer: Self-pay | Admitting: Internal Medicine

## 2021-06-21 ENCOUNTER — Telehealth: Payer: Self-pay

## 2021-06-21 ENCOUNTER — Encounter: Payer: Self-pay | Admitting: Internal Medicine

## 2021-06-21 DIAGNOSIS — Z01419 Encounter for gynecological examination (general) (routine) without abnormal findings: Secondary | ICD-10-CM

## 2021-06-21 NOTE — Telephone Encounter (Signed)
Patient returning your call to schedule procedure

## 2021-06-21 NOTE — Telephone Encounter (Signed)
Given that she is on continuous birth control - I do recommend f/u with GYN.  Does she have a preference of [facility] or [facility].  These are the two other gyn offices in [place].

## 2021-06-22 NOTE — Telephone Encounter (Signed)
Order placed for gyn referral.  

## 2021-06-22 NOTE — Telephone Encounter (Signed)
Patient does not have a preference who she sees. Would prefer to have some one (female) that is familiar with working with hormones since her issues are hormonal.

## 2021-06-28 ENCOUNTER — Encounter: Payer: Self-pay | Admitting: Internal Medicine

## 2021-06-28 DIAGNOSIS — Z1211 Encounter for screening for malignant neoplasm of colon: Secondary | ICD-10-CM

## 2021-06-29 NOTE — Telephone Encounter (Signed)
yes

## 2021-06-29 NOTE — Telephone Encounter (Signed)
Okay to place this referral

## 2021-07-02 ENCOUNTER — Ambulatory Visit: Payer: Managed Care, Other (non HMO) | Admitting: Urology

## 2021-07-02 ENCOUNTER — Other Ambulatory Visit: Payer: Self-pay

## 2021-07-02 ENCOUNTER — Encounter: Payer: Self-pay | Admitting: Urology

## 2021-07-02 VITALS — BP 143/83 | HR 101 | Ht 63.0 in | Wt 145.0 lb

## 2021-07-02 DIAGNOSIS — Z87442 Personal history of urinary calculi: Secondary | ICD-10-CM

## 2021-07-02 DIAGNOSIS — N1330 Unspecified hydronephrosis: Secondary | ICD-10-CM

## 2021-07-02 DIAGNOSIS — N200 Calculus of kidney: Secondary | ICD-10-CM | POA: Diagnosis not present

## 2021-07-02 LAB — URINALYSIS, COMPLETE
Bilirubin, UA: NEGATIVE
Glucose, UA: NEGATIVE
Ketones, UA: NEGATIVE
Nitrite, UA: NEGATIVE
Protein,UA: NEGATIVE
Specific Gravity, UA: 1.02 (ref 1.005–1.030)
Urobilinogen, Ur: 0.2 mg/dL (ref 0.2–1.0)
pH, UA: 5.5 (ref 5.0–7.5)

## 2021-07-02 LAB — MICROSCOPIC EXAMINATION
Bacteria, UA: NONE SEEN
Epithelial Cells (non renal): >10 /hpf — AB (ref 0–10)
RBC, Urine: NONE SEEN /hpf (ref 0–2)
Renal Epithel, UA: NONE SEEN /hpf
WBC, UA: >30 /hpf — AB (ref 0–5)

## 2021-07-02 NOTE — Patient Instructions (Signed)
Dietary Guidelines to Help Prevent Kidney Stones Kidney stones are deposits of minerals and salts that form inside your kidneys. Your risk of developing kidney stones may be greater depending on your diet, your lifestyle, the medicines you take, and whether you have certain medical conditions. Most people can lower their chances of developing kidney stones by following the instructions below. Your dietitian may give you more specific instructions depending on your overall health and the type of kidney stones you tend to develop. What are tips for following this plan? Reading food labels  Choose foods with "no salt added" or "low-salt" labels. Limit your salt (sodium) intake to less than 1,500 mg a day. Choose foods with calcium for each meal and snack. Try to eat about 300 mg of calcium at each meal. Foods that contain 200-500 mg of calcium a serving include: 8 oz (237 mL) of milk, calcium-fortifiednon-dairy milk, and calcium-fortifiedfruit juice. Calcium-fortified means that calcium has been added to these drinks. 8 oz (237 mL) of kefir, yogurt, and soy yogurt. 4 oz (114 g) of tofu. 1 oz (28 g) of cheese. 1 cup (150 g) of dried figs. 1 cup (91 g) of cooked broccoli. One 3 oz (85 g) can of sardines or mackerel. Most people need 1,000-1,500 mg of calcium a day. Talk to your dietitian about how much calcium is recommended for you. Shopping Buy plenty of fresh fruits and vegetables. Most people do not need to avoid fruits and vegetables, even if these foods contain nutrients that may contribute to kidney stones. When shopping for convenience foods, choose: Whole pieces of fruit. Pre-made salads with dressing on the side. Low-fat fruit and yogurt smoothies. Avoid buying frozen meals or prepared deli foods. These can be high in sodium. Look for foods with live cultures, such as yogurt and kefir. Choose high-fiber grains, such as whole-wheat breads, oat bran, and wheat cereals. Cooking Do not add  salt to food when cooking. Place a salt shaker on the table and allow each person to add his or her own salt to taste. Use vegetable protein, such as beans, textured vegetable protein (TVP), or tofu, instead of meat in pasta, casseroles, and soups. Meal planning Eat less salt, if told by your dietitian. To do this: Avoid eating processed or pre-made food. Avoid eating fast food. Eat less animal protein, including cheese, meat, poultry, or fish, if told by your dietitian. To do this: Limit the number of times you have meat, poultry, fish, or cheese each week. Eat a diet free of meat at least 2 days a week. Eat only one serving each day of meat, poultry, fish, or seafood. When you prepare animal protein, cut pieces into small portion sizes. For most meat and fish, one serving is about the size of the palm of your hand. Eat at least five servings of fresh fruits and vegetables each day. To do this: Keep fruits and vegetables on hand for snacks. Eat one piece of fruit or a handful of berries with breakfast. Have a salad and fruit at lunch. Have two kinds of vegetables at dinner. Limit foods that are high in a substance called oxalate. These include: Spinach (cooked), rhubarb, beets, sweet potatoes, and Swiss chard. Peanuts. Potato chips, french fries, and baked potatoes with skin on. Nuts and nut products. Chocolate. If you regularly take a diuretic medicine, make sure to eat at least 1 or 2 servings of fruits or vegetables that are high in potassium each day. These include: Avocado. Banana. Orange, prune,   carrot, or tomato juice. Baked potato. Cabbage. Beans and split peas. Lifestyle  Drink enough fluid to keep your urine pale yellow. This is the most important thing you can do. Spread your fluid intake throughout the day. If you drink alcohol: Limit how much you use to: 0-1 drink a day for women who are not pregnant. 0-2 drinks a day for men. Be aware of how much alcohol is in your  drink. In the [place] one drink equals one 12 oz bottle of beer (355 mL), one 5 oz glass of wine (148 mL), or one 1 oz glass of hard liquor (44 mL). Lose weight if told by your health care provider. Work with your dietitian to find an eating plan and weight loss strategies that work best for you. General information Talk to your health care provider and dietitian about taking daily supplements. You may be told the following depending on your health and the cause of your kidney stones: Not to take supplements with vitamin C. To take a calcium supplement. To take a daily probiotic supplement. To take other supplements such as magnesium, fish oil, or vitamin B6. Take over-the-counter and prescription medicines only as told by your health care provider. These include supplements. What foods should I limit? Limit your intake of the following foods, or eat them as told by your dietitian. Vegetables Spinach. Rhubarb. Beets. Canned vegetables. Pickles. Olives. Baked potatoes with skin. Grains Wheat bran. Baked goods. Salted crackers. Cereals high in sugar. Meats and other proteins Nuts. Nut butters. Large portions of meat, poultry, or fish. Salted, precooked, or cured meats, such as sausages, meat loaves, and hot dogs. Dairy Cheese. Beverages Regular soft drinks. Regular vegetable juice. Seasonings and condiments Seasoning blends with salt. Salad dressings. Soy sauce. Ketchup. Barbecue sauce. Other foods Canned soups. Canned pasta sauce. Casseroles. Pizza. Lasagna. Frozen meals. Potato chips. French fries. The items listed above may not be a complete list of foods and beverages you should limit. Contact a dietitian for more information. What foods should I avoid? Talk to your dietitian about specific foods you should avoid based on the type of kidney stones you have and your overall health. Fruits Grapefruit. The item listed above may not be a complete list of foods and beverages you should  avoid. Contact a dietitian for more information. Summary Kidney stones are deposits of minerals and salts that form inside your kidneys. You can lower your risk of kidney stones by making changes to your diet. The most important thing you can do is drink enough fluid. Drink enough fluid to keep your urine pale yellow. Talk to your dietitian about how much calcium you should have each day, and eat less salt and animal protein as told by your dietitian. This information is not intended to replace advice given to you by your health care provider. Make sure you discuss any questions you have with your health care provider. Document Revised: 07/11/2019 Document Reviewed: 07/11/2019 Elsevier Patient Education  2022 Elsevier Inc.  

## 2021-07-02 NOTE — Progress Notes (Signed)
07/02/2021 11:27 AM   Cassandra Matthews 06-10-1971 [id number]  Referring provider: Dale Benton, MD [address] [place],  [place] [address]  Chief Complaint  Patient presents with   Hydronephrosis    HPI: 50 y.o. female referred for hydronephrosis.  Seen in ED 09/2020 and CT showed bilateral nephrolithiasis with a 5 mm right UPJ calculus with mild hydronephrosis She subsequently passed the stone but did not save Symptoms resolved after stone passage Denies previous history of stone disease CT did show bilateral, nonobstructing renal calculi Presently asymptomatic   PMH: Past Medical History:  Diagnosis Date   Allergy    Fainting spell    Headache(784.0)    Recurrent urinary tract infection     Surgical History: Past Surgical History:  Procedure Laterality Date   CESAREAN SECTION  2008    Home Medications:  Allergies as of 07/02/2021       Reactions   Sulfa Antibiotics         Medication List        Accurate as of July 02, 2021 11:27 AM. If you have any questions, ask your nurse or doctor.          B-D 3CC LUER-LOK SYR 25GX1" 25G X 1" 3 ML Misc Generic drug: SYRINGE-NEEDLE (DISP) 3 ML USE AS DIRECTED FOR UP TO 30 DAYS   cyanocobalamin 1000 MCG/ML injection Commonly known as: (VITAMIN B-12) Inject 1000 MCG/ML IM weekly x 4 weeks then once a month there after.   JUNEL FE 1.5/30 PO Take 1 tablet by mouth daily.   topiramate 50 MG tablet Commonly known as: TOPAMAX TAKE 1 & 1/2 TABLETS BY MOUTH 2 TIMES A DAY        Allergies:  Allergies  Allergen Reactions   Sulfa Antibiotics     Family History: Family History  Problem Relation Age of Onset   Hyperlipidemia Mother    Diabetes Maternal Aunt    Diabetes Maternal Uncle    Cancer Maternal Uncle        lung   Cancer Paternal Aunt        uterus    Social History:  reports that she has never smoked. She has never used smokeless tobacco. She reports current  alcohol use. She reports that she does not use drugs.   Physical Exam: BP (!) 143/83   Pulse (!) 101   Ht 5\' 3"  (1.6 m)   Wt 145 lb (65.8 kg)   BMI 25.69 kg/m   Constitutional:  Alert and oriented, No acute distress. HEENT: [place] AT, moist mucus membranes.  Trachea midline, no masses. Cardiovascular: No clubbing, cyanosis, or edema. Respiratory: Normal respiratory effort, no increased work of breathing. Psychiatric: Normal mood and affect.   Pertinent Imaging: CT images personally reviewed and interpreted  CT Renal Stone Study  Narrative CLINICAL DATA:  Acute right flank pain.  EXAM: CT ABDOMEN AND PELVIS WITHOUT CONTRAST  TECHNIQUE: Multidetector CT imaging of the abdomen and pelvis was performed following the standard protocol without IV contrast.  COMPARISON:  None.  FINDINGS: Lower chest: No acute abnormality.  Hepatobiliary: No focal liver abnormality is seen. No gallstones, gallbladder wall thickening, or biliary dilatation.  Pancreas: Unremarkable. No pancreatic ductal dilatation or surrounding inflammatory changes.  Spleen: Normal in size without focal abnormality.  Adrenals/Urinary Tract: Adrenal glands appear normal. Bilateral nephrolithiasis is noted. Mild right hydronephrosis is noted which appears to be due to 5 mm calculus at the ureteropelvic junction. Urinary bladder is unremarkable.  Stomach/Bowel:  Stomach is within normal limits. Appendix appears normal. No evidence of bowel wall thickening, distention, or inflammatory changes.  Vascular/Lymphatic: No significant vascular findings are present. No enlarged abdominal or pelvic lymph nodes.  Reproductive: Enlarged fibroid uterus is noted. No adnexal abnormality is noted.  Other: No abdominal wall hernia or abnormality. No abdominopelvic ascites.  Musculoskeletal: No acute or significant osseous findings.  IMPRESSION: 1. Bilateral nephrolithiasis. Mild right hydronephrosis is noted which  appears to be due to 5 mm calculus at the right ureteropelvic junction. 2. Enlarged fibroid uterus.   Electronically Signed By: Lupita Raider M.D. On: 09/03/2020 09:23   Assessment & Plan:    1. Hydronephrosis Secondary to right UPJ calculus which she subsequently passed  2.  Bilateral nephrolithiasis KUB ordered and she will be notified with the results Discussed metabolic evaluation which she would like to think over 1 year follow-up scheduled with KUB Call earlier for recurrent flank/abdominal pain   Riki Altes, MD  [facility] [address] , [place] [address] [phone]

## 2021-07-04 ENCOUNTER — Encounter: Payer: Self-pay | Admitting: Urology

## 2021-07-05 ENCOUNTER — Other Ambulatory Visit: Payer: Self-pay

## 2021-07-05 DIAGNOSIS — Z1211 Encounter for screening for malignant neoplasm of colon: Secondary | ICD-10-CM

## 2021-07-05 NOTE — Telephone Encounter (Signed)
Referral started for patient. Message sent to let her know.

## 2021-07-29 NOTE — Telephone Encounter (Signed)
Spoke with patient. Referral was sent to [facility]. She wanted to be referred to [facility]. Tried to reach out to [facility] for a week to schedule and got no response. New referral placed for [facility] and provided patient with number to [facility].

## 2021-08-10 ENCOUNTER — Encounter: Payer: Self-pay | Admitting: Internal Medicine

## 2021-08-11 NOTE — Telephone Encounter (Signed)
I am ok to reestablish with him if he desires.  (Confirm he is not seeing Dr French Ana.  It appears he had a physical with her in 2019).

## 2021-08-13 ENCOUNTER — Ambulatory Visit
Admission: RE | Admit: 2021-08-13 | Discharge: 2021-08-13 | Disposition: A | Discharge status: Home / Self Care | Payer: Managed Care, Other (non HMO) | Source: Ambulatory Visit | Attending: Urology | Admitting: Urology

## 2021-08-13 ENCOUNTER — Ambulatory Visit
Admission: RE | Admit: 2021-08-13 | Discharge: 2021-08-13 | Disposition: A | Discharge status: Home / Self Care | Payer: Managed Care, Other (non HMO) | Attending: Urology | Admitting: Urology

## 2021-08-13 DIAGNOSIS — N200 Calculus of kidney: Secondary | ICD-10-CM | POA: Diagnosis present

## 2021-08-16 ENCOUNTER — Encounter: Payer: Self-pay | Admitting: *Deleted

## 2021-08-17 NOTE — Telephone Encounter (Signed)
Ok

## 2021-08-17 NOTE — Telephone Encounter (Signed)
Confirmed he is not planning to see Dr Olivia Mackie anymore, would prefer to switch and re-establish with you. Still ok to to schedule?

## 2021-08-19 NOTE — Telephone Encounter (Signed)
Scheduled new pt appt for husband

## 2021-09-15 ENCOUNTER — Other Ambulatory Visit: Payer: Self-pay | Admitting: Internal Medicine

## 2021-09-23 ENCOUNTER — Encounter: Payer: Self-pay | Admitting: Obstetrics and Gynecology

## 2021-09-23 ENCOUNTER — Ambulatory Visit: Payer: Managed Care, Other (non HMO) | Admitting: Obstetrics and Gynecology

## 2021-09-23 ENCOUNTER — Other Ambulatory Visit: Payer: Self-pay

## 2021-09-23 VITALS — BP 108/80 | Ht 63.0 in | Wt 151.0 lb

## 2021-09-23 DIAGNOSIS — Z01419 Encounter for gynecological examination (general) (routine) without abnormal findings: Secondary | ICD-10-CM

## 2021-09-23 DIAGNOSIS — Z1211 Encounter for screening for malignant neoplasm of colon: Secondary | ICD-10-CM

## 2021-09-23 DIAGNOSIS — Z3041 Encounter for surveillance of contraceptive pills: Secondary | ICD-10-CM

## 2021-09-23 DIAGNOSIS — Z1231 Encounter for screening mammogram for malignant neoplasm of breast: Secondary | ICD-10-CM | POA: Diagnosis not present

## 2021-09-23 MED ORDER — JUNEL FE 1.5/30 1.5-30 MG-MCG PO TABS: 1.0000 | ORAL_TABLET | Freq: Every day | ORAL | 4 refills | Status: DC

## 2021-09-23 NOTE — Progress Notes (Signed)
PCP: Dale Verona, MD   Chief Complaint  Patient presents with   Gynecologic Exam    No concerns    HPI:      Cassandra Matthews is a 51 y.o. [id number] whose LMP was No LMP recorded. (Menstrual status: Oral contraceptives)., presents today for her NP annual examination, referred by PCP.  Her menses are absent with cont dosing of OCPs for headache mgmt. Still has headaches on active pills same time each pack, but headaches are 2-3 days instead of 7 with OCPs. Not typical migraines, no aura. Feels hotter in general but no vasomotor sx. Pt wonders how she'll know when to stop OCPs. Thinks her mom and sister were in their 52s with menopause. No hx of HTN, DVTs, tob use.  Sex activity: single partner, contraception - OCP (estrogen/progesterone). She does not have vaginal dryness.  Last Pap: 01/28/19  Results were: no abnormalities /neg HPV DNA. No hx of abn paps  Last mammogram: 09/28/20 at [facility];  Results were: normal--routine follow-up in 12 months There is no FH of breast cancer. There is no FH of ovarian cancer. The patient does do self-breast exams.  Colonoscopy: never; has [facility] appt next wk  Tobacco use: The patient denies current or previous tobacco use. Alcohol use: social drinker No drug use Exercise: min active  She does get adequate calcium but not Vitamin D in her diet.  Labs with PCP.   Patient Active Problem List   Diagnosis Date Noted   Hydronephrosis 05/22/2021   History of kidney stones 05/20/2021   B12 deficiency 05/20/2021   Heavy menses 01/11/2016   Health care maintenance 01/11/2015   Pelvic pain in female 01/11/2015   Recurrent urinary tract infection 12/02/2012   Urinary tract infection 12/02/2012   Headache 11/30/2012   Chronic cystitis 09/06/2012    Past Surgical History:  Procedure Laterality Date   CESAREAN SECTION  2008    Family History  Problem Relation Age of Onset   Hyperlipidemia Mother    Diabetes Maternal Aunt    Lung cancer  Maternal Uncle    Lung cancer Paternal Aunt    Uterine cancer Paternal Aunt        30s   Lung cancer Paternal Aunt     Social History   Socioeconomic History   Marital status: Married    Spouse name: Not on file   Number of children: 2   Years of education: Not on file   Highest education level: Not on file  Occupational History    Employer: [employer].  Tobacco Use   Smoking status: Never   Smokeless tobacco: Never  Vaping Use   Vaping Use: Never used  Substance and Sexual Activity   Alcohol use: Yes    Alcohol/week: 0.0 standard drinks   Drug use: No   Sexual activity: Yes    Birth control/protection: Pill  Other Topics Concern   Not on file  Social History Narrative   Not on file   Social Determinants of Health   Financial Resource Strain: Not on file  Food Insecurity: Not on file  Transportation Needs: Not on file  Physical Activity: Not on file  Stress: Not on file  Social Connections: Not on file  Intimate Partner Violence: Not on file     Current Outpatient Medications:    B-D 3CC LUER-LOK SYR 25GX1" 25G X 1" 3 ML MISC, USE AS DIRECTED FOR UP TO 30 DAYS, Disp: 50 each, Rfl: 1  COLLAGEN PO, Use, Disp: , Rfl:    cyanocobalamin (,VITAMIN B-12,) 1000 MCG/ML injection, Inject 1000 MCG/ML IM weekly x 4 weeks then once a month there after., Disp: 10 mL, Rfl: 1   topiramate (TOPAMAX) 50 MG tablet, TAKE 1 & 1/2 TABLETS BY MOUTH 2 TIMES A DAY, Disp: 270 tablet, Rfl: 0   JUNEL FE 1.5/30 1.5-30 MG-MCG tablet, Take 1 tablet by mouth daily. CONTINUOUS DOSING, Disp: 84 tablet, Rfl: 4     ROS:  Review of Systems  Constitutional:  Negative for fatigue, fever and unexpected weight change.  Respiratory:  Negative for cough, shortness of breath and wheezing.   Cardiovascular:  Negative for chest pain, palpitations and leg swelling.  Gastrointestinal:  Negative for blood in stool, constipation, diarrhea, nausea and vomiting.  Endocrine: Negative for cold  intolerance, heat intolerance and polyuria.  Genitourinary:  Negative for dyspareunia, dysuria, flank pain, frequency, genital sores, hematuria, menstrual problem, pelvic pain, urgency, vaginal bleeding, vaginal discharge and vaginal pain.  Musculoskeletal:  Negative for back pain, joint swelling and myalgias.  Skin:  Negative for rash.  Neurological:  Positive for headaches. Negative for dizziness, syncope, light-headedness and numbness.  Hematological:  Negative for adenopathy.  Psychiatric/Behavioral:  Negative for agitation, confusion, sleep disturbance and suicidal ideas. The patient is not nervous/anxious.   BREAST: No symptoms    Objective: BP 108/80    Ht 5\' 3"  (1.6 m)    Wt 151 lb (68.5 kg)    BMI 26.75 kg/m    Physical Exam Constitutional:      Appearance: She is well-developed.  Genitourinary:     Vulva normal.     Right Labia: No rash, tenderness or lesions.    Left Labia: No tenderness, lesions or rash.    No vaginal discharge, erythema or tenderness.      Right Adnexa: not tender and no mass present.    Left Adnexa: not tender and no mass present.    No cervical friability or polyp.     Uterus is not enlarged or tender.  Breasts:    Right: No mass, nipple discharge, skin change or tenderness.     Left: No mass, nipple discharge, skin change or tenderness.  Neck:     Thyroid: No thyromegaly.  Cardiovascular:     Rate and Rhythm: Normal rate and regular rhythm.     Heart sounds: Normal heart sounds. No murmur heard. Pulmonary:     Effort: Pulmonary effort is normal.     Breath sounds: Normal breath sounds.  Abdominal:     Palpations: Abdomen is soft.     Tenderness: There is no abdominal tenderness. There is no guarding or rebound.  Musculoskeletal:        General: Normal range of motion.     Cervical back: Normal range of motion.  Lymphadenopathy:     Cervical: No cervical adenopathy.  Neurological:     General: No focal deficit present.     Mental  Status: She is alert and oriented to person, place, and time.     Cranial Nerves: No cranial nerve deficit.  Skin:    General: Skin is warm and dry.  Psychiatric:        Mood and Affect: Mood normal.        Behavior: Behavior normal.        Thought Content: Thought content normal.        Judgment: Judgment normal.  Vitals reviewed.    Assessment/Plan:  Encounter for annual routine gynecological  examination  Encounter for surveillance of contraceptive pills - Plan: JUNEL FE 1.5/30 1.5-30 MG-MCG tablet; cont dosing of OCPs for headaches. Doing well. Discussed stopping age [age] to see where she is in perimenopause.   Encounter for screening mammogram for malignant neoplasm of breast - Plan: MM 3D SCREEN BREAST BILATERAL; pt to schedule mammo  Screening for colon cancer--has appt with [facility]  Meds ordered this encounter  Medications   JUNEL FE 1.5/30 1.5-30 MG-MCG tablet    Sig: Take 1 tablet by mouth daily. CONTINUOUS DOSING    Dispense:  84 tablet    Refill:  4    Order Specific Question:   Supervising Provider    Answer:   Gae Dry [id number]            GYN counsel breast self exam, mammography screening, menopause, adequate intake of calcium and vitamin D, diet and exercise    F/U  Return in about 1 year (around 09/23/2022).  Merton Wadlow B. Skyrah Krupp, PA-C 09/23/2021 4:30 PM

## 2021-10-22 ENCOUNTER — Other Ambulatory Visit: Payer: Self-pay | Admitting: Internal Medicine

## 2021-10-22 DIAGNOSIS — Z1231 Encounter for screening mammogram for malignant neoplasm of breast: Secondary | ICD-10-CM

## 2021-10-29 ENCOUNTER — Ambulatory Visit
Admission: RE | Admit: 2021-10-29 | Discharge: 2021-10-29 | Disposition: A | Discharge status: Home / Self Care | Payer: Managed Care, Other (non HMO) | Source: Ambulatory Visit | Attending: Internal Medicine | Admitting: Internal Medicine

## 2021-10-29 DIAGNOSIS — Z1231 Encounter for screening mammogram for malignant neoplasm of breast: Secondary | ICD-10-CM

## 2022-02-16 ENCOUNTER — Ambulatory Visit: Payer: Managed Care, Other (non HMO) | Admitting: Urology

## 2022-05-27 ENCOUNTER — Ambulatory Visit (INDEPENDENT_AMBULATORY_CARE_PROVIDER_SITE_OTHER): Payer: Managed Care, Other (non HMO) | Admitting: Internal Medicine

## 2022-05-27 ENCOUNTER — Encounter: Payer: Self-pay | Admitting: Internal Medicine

## 2022-05-27 ENCOUNTER — Other Ambulatory Visit (HOSPITAL_COMMUNITY)
Admission: RE | Admit: 2022-05-27 | Discharge: 2022-05-27 | Disposition: A | Discharge status: Home / Self Care | Payer: Managed Care, Other (non HMO) | Source: Ambulatory Visit | Attending: Internal Medicine | Admitting: Internal Medicine

## 2022-05-27 VITALS — BP 122/80 | HR 71 | Temp 98.3°F | Ht 63.0 in | Wt 148.4 lb

## 2022-05-27 DIAGNOSIS — Z13220 Encounter for screening for lipoid disorders: Secondary | ICD-10-CM

## 2022-05-27 DIAGNOSIS — R519 Headache, unspecified: Secondary | ICD-10-CM | POA: Diagnosis not present

## 2022-05-27 DIAGNOSIS — Z124 Encounter for screening for malignant neoplasm of cervix: Secondary | ICD-10-CM

## 2022-05-27 DIAGNOSIS — Z23 Encounter for immunization: Secondary | ICD-10-CM

## 2022-05-27 DIAGNOSIS — E538 Deficiency of other specified B group vitamins: Secondary | ICD-10-CM | POA: Diagnosis not present

## 2022-05-27 DIAGNOSIS — Z87442 Personal history of urinary calculi: Secondary | ICD-10-CM

## 2022-05-27 DIAGNOSIS — Z0000 Encounter for general adult medical examination without abnormal findings: Secondary | ICD-10-CM

## 2022-05-27 LAB — COMPREHENSIVE METABOLIC PANEL
ALT: 13 U/L (ref 0–35)
AST: 16 U/L (ref 0–37)
Albumin: 4 g/dL (ref 3.5–5.2)
Alkaline Phosphatase: 43 U/L (ref 39–117)
BUN: 13 mg/dL (ref 6–23)
CO2: 26 mEq/L (ref 19–32)
Calcium: 9.2 mg/dL (ref 8.4–10.5)
Chloride: 107 mEq/L (ref 96–112)
Creatinine, Ser: 0.64 mg/dL (ref 0.40–1.20)
GFR: 102.8 mL/min (ref >60.00)
Glucose, Bld: 77 mg/dL (ref 70–99)
Potassium: 4.4 mEq/L (ref 3.5–5.1)
Sodium: 139 mEq/L (ref 135–145)
Total Bilirubin: 0.4 mg/dL (ref 0.2–1.2)
Total Protein: 6.8 g/dL (ref 6.0–8.3)

## 2022-05-27 LAB — LIPID PANEL
Cholesterol: 165 mg/dL (ref 0–200)
HDL: 39.8 mg/dL (ref >39.00)
LDL Cholesterol: 107 mg/dL — ABNORMAL HIGH (ref 0–99)
NonHDL: 124.75
Total CHOL/HDL Ratio: 4
Triglycerides: 89 mg/dL (ref 0.0–149.0)
VLDL: 17.8 mg/dL (ref 0.0–40.0)

## 2022-05-27 LAB — CBC WITH DIFFERENTIAL/PLATELET
Basophils Absolute: 0.1 10*3/uL (ref 0.0–0.1)
Basophils Relative: 1 % (ref 0.0–3.0)
Eosinophils Absolute: 0.2 10*3/uL (ref 0.0–0.7)
Eosinophils Relative: 2.4 % (ref 0.0–5.0)
HCT: 42.3 % (ref 36.0–46.0)
Hemoglobin: 13.6 g/dL (ref 12.0–15.0)
Lymphocytes Relative: 31.9 % (ref 12.0–46.0)
Lymphs Abs: 2.2 10*3/uL (ref 0.7–4.0)
MCHC: 32.1 g/dL (ref 30.0–36.0)
MCV: 82.9 fl (ref 78.0–100.0)
Monocytes Absolute: 0.5 10*3/uL (ref 0.1–1.0)
Monocytes Relative: 7.9 % (ref 3.0–12.0)
Neutro Abs: 3.9 10*3/uL (ref 1.4–7.7)
Neutrophils Relative %: 56.8 % (ref 43.0–77.0)
Platelets: 281 10*3/uL (ref 150.0–400.0)
RBC: 5.1 Mil/uL (ref 3.87–5.11)
RDW: 14.2 % (ref 11.5–15.5)
WBC: 6.9 10*3/uL (ref 4.0–10.5)

## 2022-05-27 LAB — TSH: TSH: 2.02 u[IU]/mL (ref 0.35–5.50)

## 2022-05-27 LAB — VITAMIN B12: Vitamin B-12: 314 pg/mL (ref 211–911)

## 2022-05-27 NOTE — Patient Instructions (Signed)
Sublingual B12 104mcg per day  Vitamin D3 1000 units per day

## 2022-05-27 NOTE — Progress Notes (Unsigned)
[name], female   DOB: [birth year], 51 y.o.   MRN: [id number]   Subjective:    [name], female    DOB: 1971/05/09, 51 y.o.   MRN: [id number]   Patient here for No chief complaint on file.  Marland Kitchen   HPI Here for her physical. Evaluated by Ardeth Perfect 0/53/97.  Dr Bernardo Heater - f/u hydronephrosis - secondary to right UPJ calculus - passed.  Recommended f/u KUB one year.     Past Medical History:  Diagnosis Date   Allergy    Fainting spell    Headache(784.0)    Recurrent urinary tract infection    Past Surgical History:  Procedure Laterality Date   CESAREAN SECTION  2008   Family History  Problem Relation Age of Onset   Hyperlipidemia Mother    Diabetes Maternal Aunt    Lung cancer Maternal Uncle    Lung cancer Paternal Aunt    Uterine cancer Paternal Aunt        30s   Lung cancer Paternal Aunt    Social History   Socioeconomic History   Marital status: Married    Spouse name: Not on file   Number of children: 2   Years of education: Not on file   Highest education level: Not on file  Occupational History    Employer: [employer].  Tobacco Use   Smoking status: Never   Smokeless tobacco: Never  Vaping Use   Vaping Use: Never used  Substance and Sexual Activity   Alcohol use: Yes    Alcohol/week: 0.0 standard drinks of alcohol   Drug use: No   Sexual activity: Yes    Birth control/protection: Pill  Other Topics Concern   Not on file  Social History Narrative   Not on file   Social Determinants of Health   Financial Resource Strain: Not on file  Food Insecurity: Not on file  Transportation Needs: Not on file  Physical Activity: Not on file  Stress: Not on file  Social Connections: Not on file     Review of Systems     Objective:     There were no vitals taken for this visit. Wt Readings from Last 3 Encounters:  09/23/21 151 lb (68.5 kg)  07/02/21 145 lb (65.8 kg)  05/20/21 148 lb 6.4 oz (67.3 kg)     Physical Exam   Outpatient Encounter Medications as of 05/27/2022  Medication Sig   B-D 3CC LUER-LOK SYR 25GX1" 25G X 1" 3 ML MISC USE AS DIRECTED FOR UP TO 30 DAYS   COLLAGEN PO Use   cyanocobalamin (,VITAMIN B-12,) 1000 MCG/ML injection Inject 1000 MCG/ML IM weekly x 4 weeks then once a month there after.   JUNEL FE 1.5/30 1.5-30 MG-MCG tablet Take 1 tablet by mouth daily. CONTINUOUS DOSING   topiramate (TOPAMAX) 50 MG tablet TAKE 1 & 1/2 TABLETS BY MOUTH 2 TIMES A DAY   No facility-administered encounter medications on file as of 05/27/2022.     Lab Results  Component Value Date   WBC 8.4 05/20/2021   HGB 13.1 05/20/2021   HCT 40.3 05/20/2021   PLT 279.0 05/20/2021   GLUCOSE 78 05/20/2021   CHOL 139 05/20/2021   TRIG 120.0 05/20/2021   HDL 42.10 05/20/2021   LDLCALC 73 05/20/2021   ALT 8 05/20/2021   AST 11 05/20/2021   NA 140 05/20/2021   K 3.8 05/20/2021   CL 108 05/20/2021   CREATININE 0.61 05/20/2021  BUN 15 05/20/2021   CO2 24 05/20/2021   TSH 1.77 05/20/2021    MM 3D SCREEN BREAST BILATERAL  Result Date: 11/01/2021 CLINICAL DATA:  Screening. EXAM: DIGITAL SCREENING BILATERAL MAMMOGRAM WITH TOMOSYNTHESIS AND CAD TECHNIQUE: Bilateral screening digital craniocaudal and mediolateral oblique mammograms were obtained. Bilateral screening digital breast tomosynthesis was performed. The images were evaluated with computer-aided detection. COMPARISON:  Previous exam(s). ACR Breast Density Category d: The breast tissue is extremely dense, which lowers the sensitivity of mammography FINDINGS: There are no findings suspicious for malignancy. IMPRESSION: No mammographic evidence of malignancy. A result letter of this screening mammogram will be mailed directly to the patient. RECOMMENDATION: Screening mammogram in one year. (Code:SM-B-01Y) BI-RADS CATEGORY  1: Negative. Electronically Signed   By: Sande Brothers M.D.   On: 11/01/2021 11:18       Assessment & Plan:    Problem List Items Addressed This Visit   None    Dale [place], MD

## 2022-05-27 NOTE — Assessment & Plan Note (Signed)
Physical today 05/27/22.  PAP 12/2019 - negative with negative HPV. Repeat pap today.  Mammogram. 10/29/21 - Briads I.  Colonoscopy 12/20/21 - Dr Alice Reichert.  Recommended f/u in 5 years.

## 2022-05-29 ENCOUNTER — Encounter: Payer: Self-pay | Admitting: Internal Medicine

## 2022-05-29 NOTE — Assessment & Plan Note (Signed)
Saw Dr Bernardo Heater as outlined.  Recommended f/u one year - KUB.  Discussed topamax and her history of kidney stones.  Only taking topamax q third day.  Wants to continue current regimen for now.  Follow.

## 2022-05-29 NOTE — Assessment & Plan Note (Signed)
Stable on current regimen.  Follow.   

## 2022-05-29 NOTE — Assessment & Plan Note (Signed)
Discussed sublingual B12 supplementation.

## 2022-06-06 LAB — CYTOLOGY - PAP
Comment: NEGATIVE
Diagnosis: NEGATIVE
High risk HPV: NEGATIVE

## 2022-07-04 ENCOUNTER — Ambulatory Visit: Payer: Managed Care, Other (non HMO) | Admitting: Urology

## 2022-07-06 ENCOUNTER — Ambulatory Visit: Payer: Managed Care, Other (non HMO) | Admitting: Urology

## 2022-08-21 ENCOUNTER — Other Ambulatory Visit: Payer: Self-pay | Admitting: Internal Medicine

## 2022-09-26 NOTE — Progress Notes (Unsigned)
PCP: Einar Pheasant, MD   No chief complaint on file.   HPI:      [name] is a 52 y.o. [id number] whose LMP was No LMP recorded. (Menstrual status: Oral contraceptives)., presents today for her annual examination.  Her menses are absent with cont dosing of OCPs for headache mgmt. Still has headaches on active pills same time each pack, but headaches are 2-3 days instead of 7 with OCPs. Not typical migraines, no aura. Feels hotter in general but no vasomotor sx. Pt wonders how she'll know when to stop OCPs. Thinks her mom and sister were in their 51s with menopause. No hx of HTN, DVTs, tob use.  Sex activity: single partner, contraception - OCP (estrogen/progesterone). She does not have vaginal dryness.  Last Pap: 05/27/22  Results were: no abnormalities /neg HPV DNA. No hx of abn paps  Last mammogram: 10/19/21 at [place];  Results were: normal--routine follow-up in 12 months There is no FH of breast cancer. There is no FH of ovarian cancer. The patient does do self-breast exams.  Colonoscopy: 3/23 at [facility], Repeat after  yrs  Tobacco use: The patient denies current or previous tobacco use. Alcohol use: social drinker No drug use Exercise: min active  She does get adequate calcium but not Vitamin D in her diet.  Labs with PCP.   Patient Active Problem List   Diagnosis Date Noted   Hydronephrosis 05/22/2021   History of kidney stones 05/20/2021   B12 deficiency 05/20/2021   Heavy menses 01/11/2016   Health care maintenance 01/11/2015   Pelvic pain in female 01/11/2015   Recurrent urinary tract infection 12/02/2012   Urinary tract infection 12/02/2012   Headache 11/30/2012   Chronic cystitis 09/06/2012    Past Surgical History:  Procedure Laterality Date   CESAREAN SECTION  2008    Family History  Problem Relation Age of Onset   Hyperlipidemia Mother    Diabetes Maternal Aunt    Lung cancer Maternal Uncle    Lung cancer Paternal Aunt    Uterine cancer  Paternal Aunt        68s   Lung cancer Paternal Aunt     Social History   Socioeconomic History   Marital status: Married    Spouse name: Not on file   Number of children: 2   Years of education: Not on file   Highest education level: Not on file  Occupational History    Employer: [employer].  Tobacco Use   Smoking status: Never   Smokeless tobacco: Never  Vaping Use   Vaping Use: Never used  Substance and Sexual Activity   Alcohol use: Yes    Alcohol/week: 0.0 standard drinks of alcohol   Drug use: No   Sexual activity: Yes    Birth control/protection: Pill  Other Topics Concern   Not on file  Social History Narrative   Not on file   Social Determinants of Health   Financial Resource Strain: Not on file  Food Insecurity: Not on file  Transportation Needs: Not on file  Physical Activity: Not on file  Stress: Not on file  Social Connections: Not on file  Intimate Partner Violence: Not on file     Current Outpatient Medications:    COLLAGEN PO, Use, Disp: , Rfl:    JUNEL FE 1.5/30 1.5-30 MG-MCG tablet, Take 1 tablet by mouth daily. CONTINUOUS DOSING, Disp: 84 tablet, Rfl: 4   topiramate (TOPAMAX) 50 MG tablet, TAKE 1 &  1/2 TABLETS BY MOUTH 2 TIMES A DAY, Disp: 270 tablet, Rfl: 0     ROS:  Review of Systems  Constitutional:  Negative for fatigue, fever and unexpected weight change.  Respiratory:  Negative for cough, shortness of breath and wheezing.   Cardiovascular:  Negative for chest pain, palpitations and leg swelling.  Gastrointestinal:  Negative for blood in stool, constipation, diarrhea, nausea and vomiting.  Endocrine: Negative for cold intolerance, heat intolerance and polyuria.  Genitourinary:  Negative for dyspareunia, dysuria, flank pain, frequency, genital sores, hematuria, menstrual problem, pelvic pain, urgency, vaginal bleeding, vaginal discharge and vaginal pain.  Musculoskeletal:  Negative for back pain, joint swelling and myalgias.   Skin:  Negative for rash.  Neurological:  Positive for headaches. Negative for dizziness, syncope, light-headedness and numbness.  Hematological:  Negative for adenopathy.  Psychiatric/Behavioral:  Negative for agitation, confusion, sleep disturbance and suicidal ideas. The patient is not nervous/anxious.    BREAST: No symptoms    Objective: There were no vitals taken for this visit.   Physical Exam Constitutional:      Appearance: She is well-developed.  Genitourinary:     Vulva normal.     Right Labia: No rash, tenderness or lesions.    Left Labia: No tenderness, lesions or rash.    No vaginal discharge, erythema or tenderness.      Right Adnexa: not tender and no mass present.    Left Adnexa: not tender and no mass present.    No cervical friability or polyp.     Uterus is not enlarged or tender.  Breasts:    Right: No mass, nipple discharge, skin change or tenderness.     Left: No mass, nipple discharge, skin change or tenderness.  Neck:     Thyroid: No thyromegaly.  Cardiovascular:     Rate and Rhythm: Normal rate and regular rhythm.     Heart sounds: Normal heart sounds. No murmur heard. Pulmonary:     Effort: Pulmonary effort is normal.     Breath sounds: Normal breath sounds.  Abdominal:     Palpations: Abdomen is soft.     Tenderness: There is no abdominal tenderness. There is no guarding or rebound.  Musculoskeletal:        General: Normal range of motion.     Cervical back: Normal range of motion.  Lymphadenopathy:     Cervical: No cervical adenopathy.  Neurological:     General: No focal deficit present.     Mental Status: She is alert and oriented to person, place, and time.     Cranial Nerves: No cranial nerve deficit.  Skin:    General: Skin is warm and dry.  Psychiatric:        Mood and Affect: Mood normal.        Behavior: Behavior normal.        Thought Content: Thought content normal.        Judgment: Judgment normal.  Vitals reviewed.      Assessment/Plan:  Encounter for annual routine gynecological examination  Encounter for surveillance of contraceptive pills - Plan: JUNEL FE 1.5/30 1.5-30 MG-MCG tablet; cont dosing of OCPs for headaches. Doing well. Discussed stopping age [age] to see where she is in perimenopause.   Encounter for screening mammogram for malignant neoplasm of breast - Plan: MM 3D SCREEN BREAST BILATERAL; pt to schedule mammo  Screening for colon cancer--has appt with [facility]  No orders of the defined types were placed in this encounter.  GYN counsel breast self exam, mammography screening, menopause, adequate intake of calcium and vitamin D, diet and exercise    F/U  No follow-ups on file.  Prakash Kimberling B. [place] Freshour, PA-C 09/26/2022 7:06 PM

## 2022-09-27 ENCOUNTER — Ambulatory Visit (INDEPENDENT_AMBULATORY_CARE_PROVIDER_SITE_OTHER): Payer: Managed Care, Other (non HMO) | Admitting: Obstetrics and Gynecology

## 2022-09-27 ENCOUNTER — Encounter: Payer: Self-pay | Admitting: Obstetrics and Gynecology

## 2022-09-27 VITALS — BP 110/60 | Ht 63.0 in | Wt 153.0 lb

## 2022-09-27 DIAGNOSIS — Z1211 Encounter for screening for malignant neoplasm of colon: Secondary | ICD-10-CM

## 2022-09-27 DIAGNOSIS — Z01419 Encounter for gynecological examination (general) (routine) without abnormal findings: Secondary | ICD-10-CM | POA: Diagnosis not present

## 2022-09-27 DIAGNOSIS — Z1231 Encounter for screening mammogram for malignant neoplasm of breast: Secondary | ICD-10-CM

## 2022-09-27 DIAGNOSIS — Z3041 Encounter for surveillance of contraceptive pills: Secondary | ICD-10-CM

## 2022-09-27 MED ORDER — JUNEL FE 1.5/30 1.5-30 MG-MCG PO TABS: 1.0000 | ORAL_TABLET | Freq: Every day | ORAL | 4 refills | Status: DC

## 2022-09-27 NOTE — Patient Instructions (Addendum)
I value your feedback and you entrusting us with your care. If you get a [facility] patient survey, I would appreciate you taking the time to let us know about your experience today. Thank you!  [facility]: [phone]      

## 2022-11-18 ENCOUNTER — Ambulatory Visit
Admission: RE | Admit: 2022-11-18 | Discharge: 2022-11-18 | Disposition: A | Discharge status: Home / Self Care | Payer: Managed Care, Other (non HMO) | Source: Ambulatory Visit | Attending: Obstetrics and Gynecology

## 2022-11-18 DIAGNOSIS — Z1231 Encounter for screening mammogram for malignant neoplasm of breast: Secondary | ICD-10-CM

## 2022-12-03 IMAGING — US US ABDOMEN LIMITED
1 series · 14 of 25 positions shown · non-contrast
Comparison: None.

CLINICAL DATA: Abdominal pain

EXAM:
ULTRASOUND ABDOMEN LIMITED RIGHT UPPER QUADRANT

[Series 1: us abdomen limited ruq (liver/gb) · 14 of 84 slices shown]
[im 1/84]
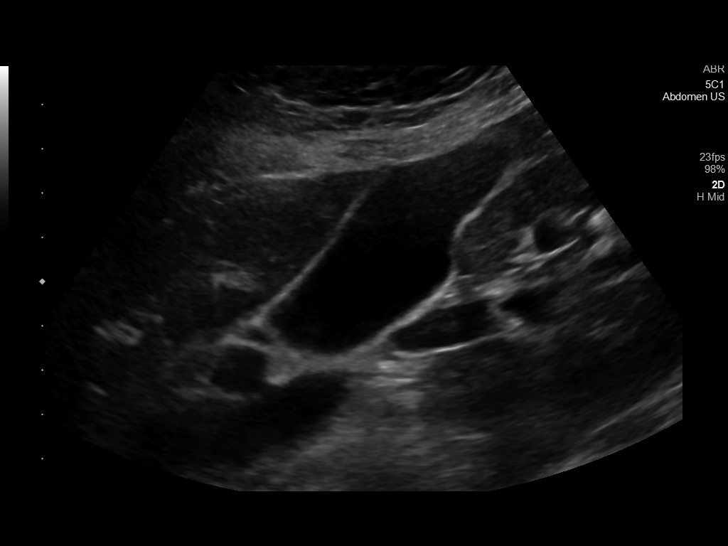
[im 7/84]
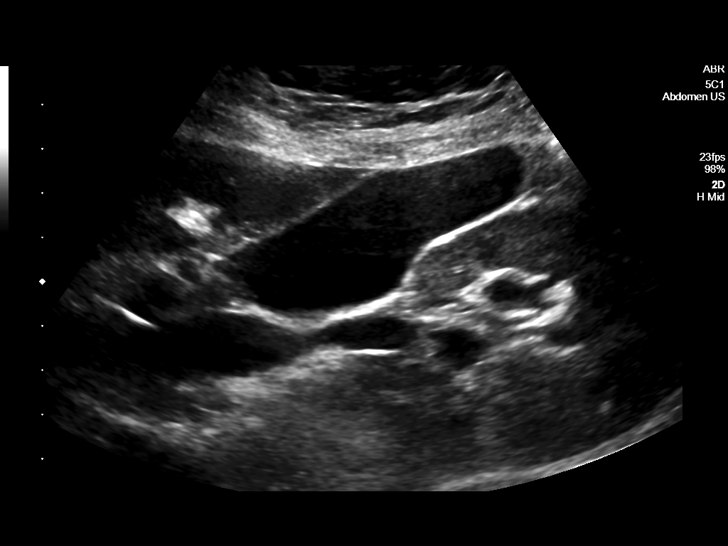
[im 14/84]
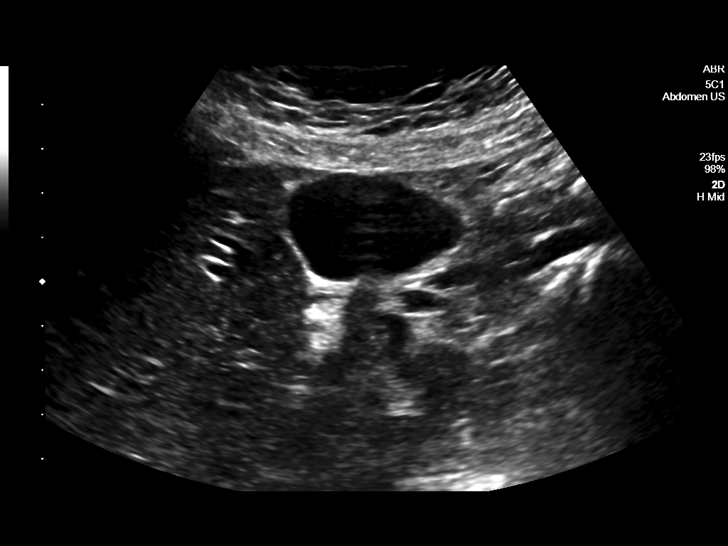
[im 21/84]
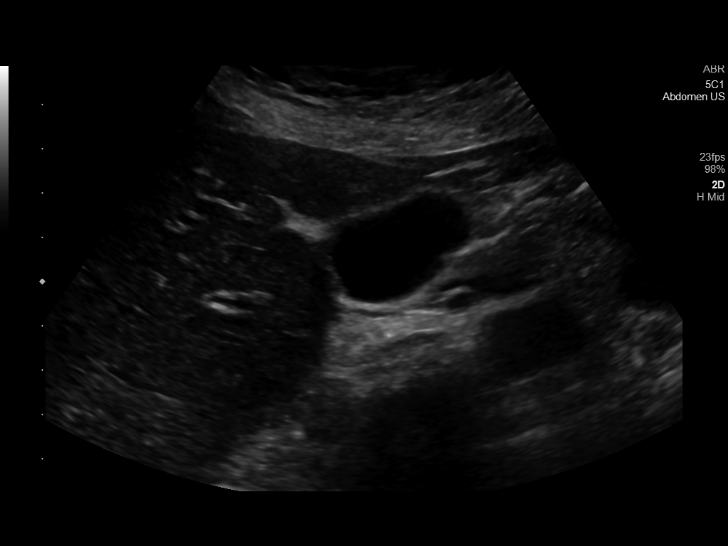
[im 28/84]
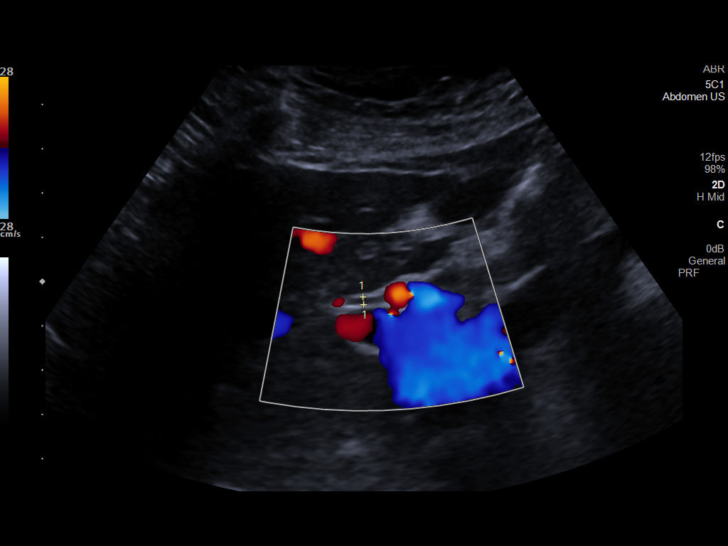
[im 32/84]
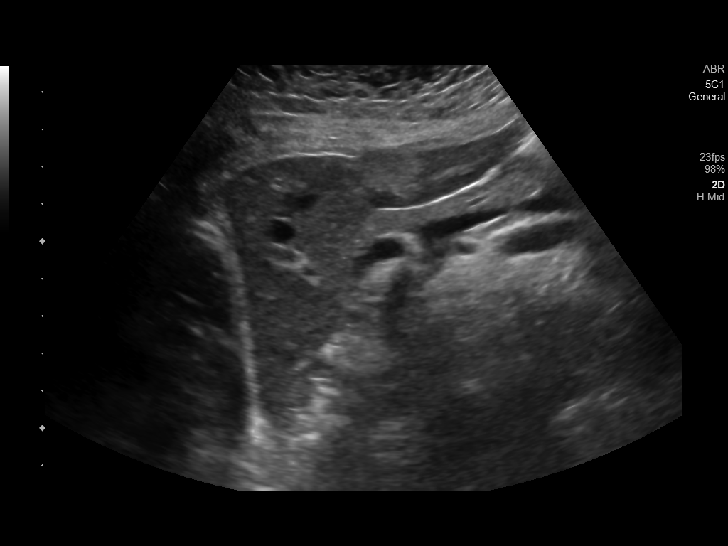
[im 39/84]
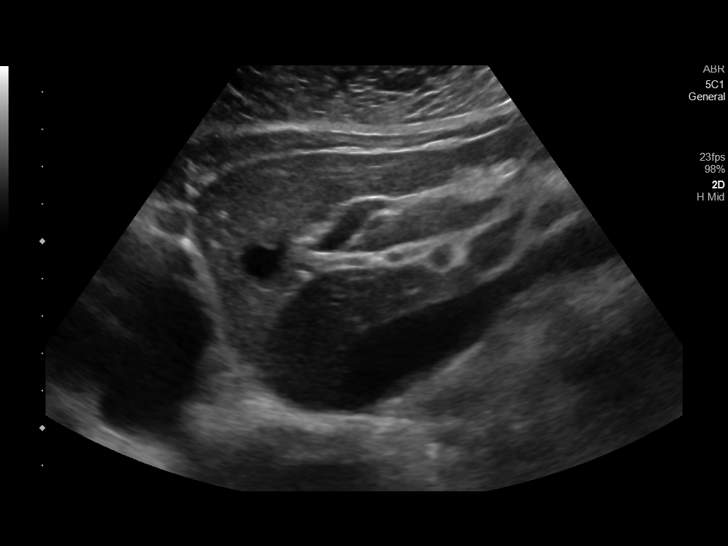
[im 45/84]
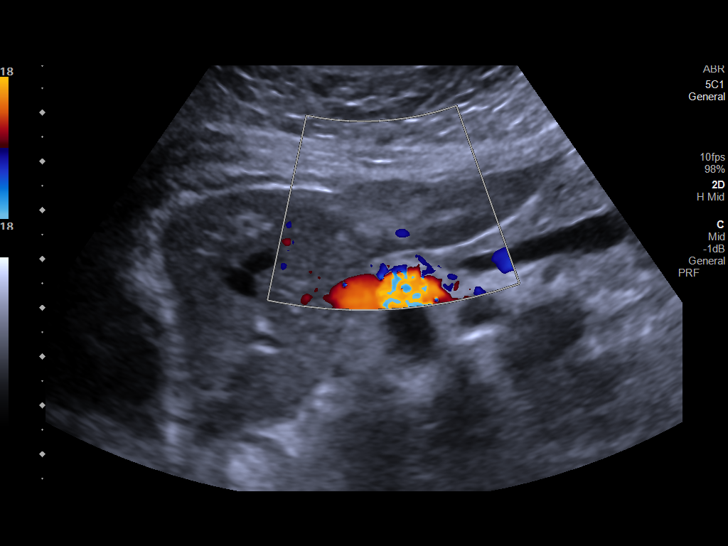
[im 52/84]
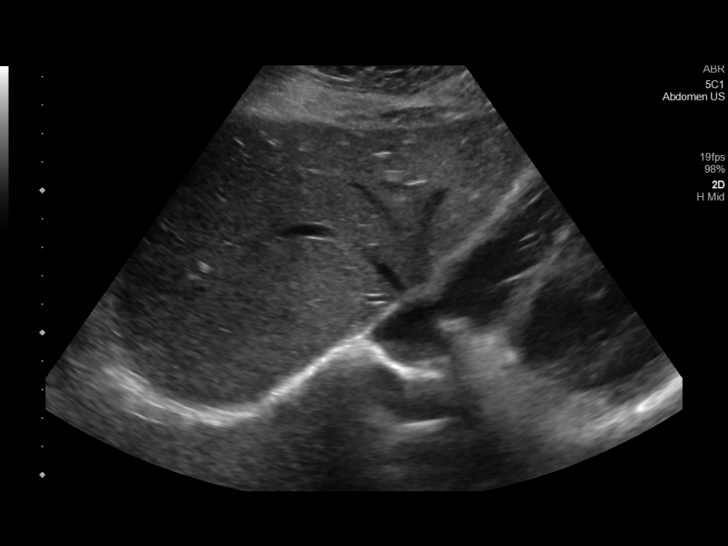
[im 56/84]
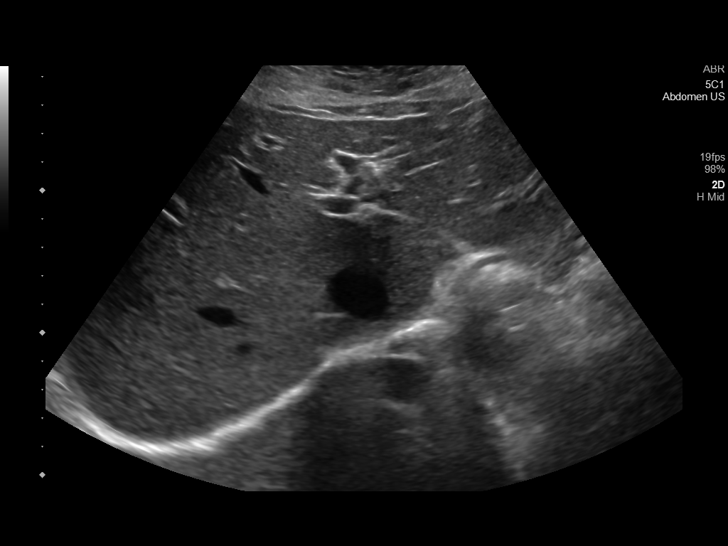
[im 63/84]
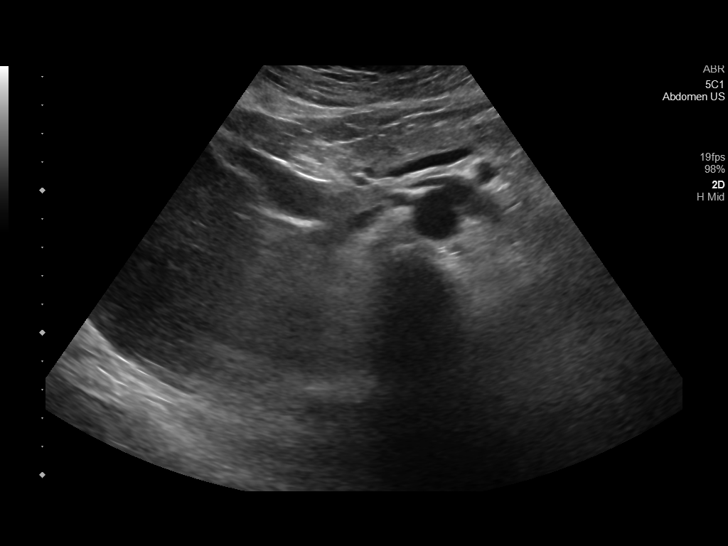
[im 70/84]
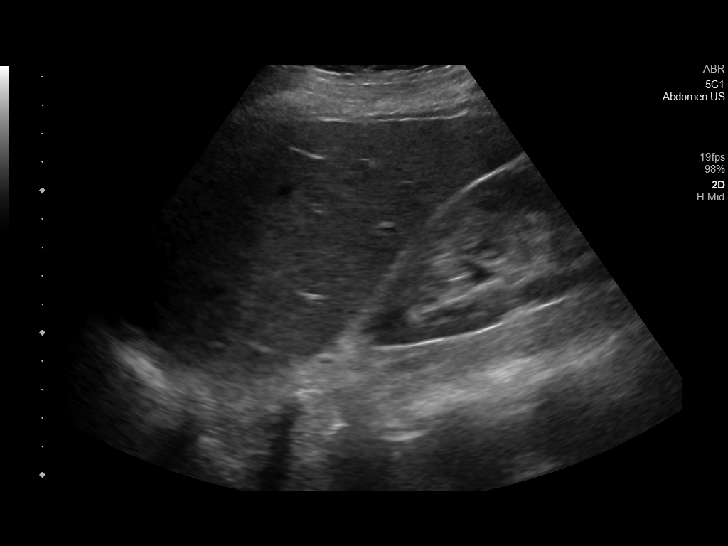
[im 77/84]
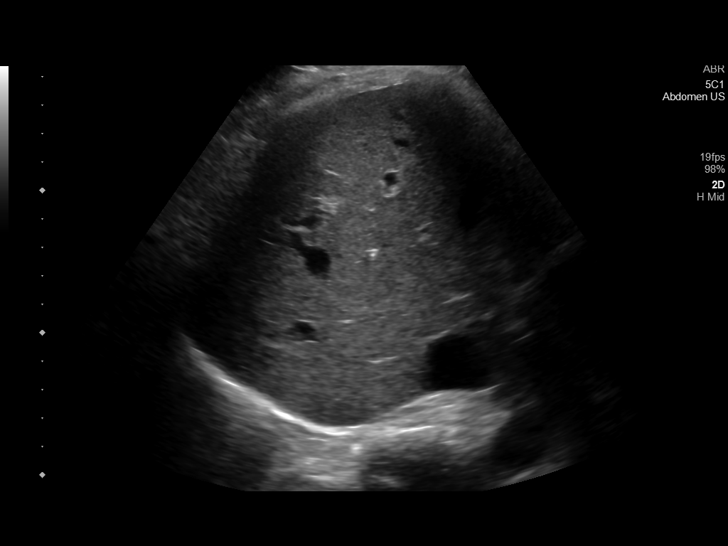
[im 84/84]
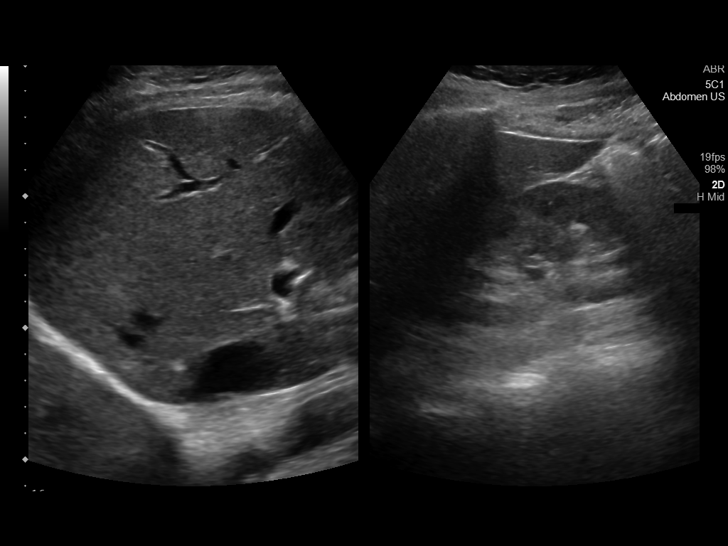

[14 of 25 positions shown; findings below may reference images not displayed]

FINDINGS: Gallbladder:

No gallstones or wall thickening visualized. No sonographic Murphy
sign noted by sonographer.

Common bile duct:

Diameter: 2 mm

Liver:

Parenchymal echogenicity within normal limits. Left hepatic lobe
lateral segment hyperechoic subcapsular lesion noted measuring 1.9 x
1.3 x 1.8 cm. Appearance suggest hemangioma. No available comparison
studies to document stability. No other hepatic abnormality by
ultrasound.

Portal vein is patent on color Doppler imaging with normal direction
of blood flow towards the liver.

Other: Included views of the right kidney demonstrated a echogenic
shadowing upper pole calculus measuring 6 mm.
IMPRESSION: Negative for gallstones or cholecystitis. No acute finding by
ultrasound.

1.9 cm left hepatic lobe subcapsular hyperechoic lesion, favored to
be hemangioma but remains indeterminate by ultrasound. No available
comparison studies. Recommend nonemergent follow-up MRI without and
with contrast for further evaluation.

Nonobstructing right nephrolithiasis.

## 2023-06-02 ENCOUNTER — Ambulatory Visit (INDEPENDENT_AMBULATORY_CARE_PROVIDER_SITE_OTHER): Payer: Managed Care, Other (non HMO) | Admitting: Internal Medicine

## 2023-06-02 VITALS — BP 112/70 | HR 81 | Temp 98.2°F | Resp 16 | Ht 63.0 in | Wt 146.0 lb

## 2023-06-02 DIAGNOSIS — E538 Deficiency of other specified B group vitamins: Secondary | ICD-10-CM

## 2023-06-02 DIAGNOSIS — G479 Sleep disorder, unspecified: Secondary | ICD-10-CM

## 2023-06-02 DIAGNOSIS — Z87442 Personal history of urinary calculi: Secondary | ICD-10-CM | POA: Diagnosis not present

## 2023-06-02 DIAGNOSIS — Z0000 Encounter for general adult medical examination without abnormal findings: Secondary | ICD-10-CM | POA: Diagnosis not present

## 2023-06-02 DIAGNOSIS — Z13220 Encounter for screening for lipoid disorders: Secondary | ICD-10-CM

## 2023-06-02 DIAGNOSIS — R519 Headache, unspecified: Secondary | ICD-10-CM

## 2023-06-02 DIAGNOSIS — Z85820 Personal history of malignant melanoma of skin: Secondary | ICD-10-CM

## 2023-06-02 LAB — LIPID PANEL
Cholesterol: 173 mg/dL (ref 0–200)
HDL: 47.3 mg/dL (ref >39.00)
LDL Cholesterol: 112 mg/dL — ABNORMAL HIGH (ref 0–99)
NonHDL: 125.97
Total CHOL/HDL Ratio: 4
Triglycerides: 69 mg/dL (ref 0.0–149.0)
VLDL: 13.8 mg/dL (ref 0.0–40.0)

## 2023-06-02 LAB — CBC WITH DIFFERENTIAL/PLATELET
Basophils Absolute: 0.1 10*3/uL (ref 0.0–0.1)
Basophils Relative: 1 % (ref 0.0–3.0)
Eosinophils Absolute: 0.2 10*3/uL (ref 0.0–0.7)
Eosinophils Relative: 2.7 % (ref 0.0–5.0)
HCT: 42.5 % (ref 36.0–46.0)
Hemoglobin: 13.3 g/dL (ref 12.0–15.0)
Lymphocytes Relative: 31.2 % (ref 12.0–46.0)
Lymphs Abs: 2.3 10*3/uL (ref 0.7–4.0)
MCHC: 31.4 g/dL (ref 30.0–36.0)
MCV: 82.9 fL (ref 78.0–100.0)
Monocytes Absolute: 0.6 10*3/uL (ref 0.1–1.0)
Monocytes Relative: 8.7 % (ref 3.0–12.0)
Neutro Abs: 4.2 10*3/uL (ref 1.4–7.7)
Neutrophils Relative %: 56.4 % (ref 43.0–77.0)
Platelets: 307 10*3/uL (ref 150.0–400.0)
RBC: 5.13 Mil/uL — ABNORMAL HIGH (ref 3.87–5.11)
RDW: 14.4 % (ref 11.5–15.5)
WBC: 7.4 10*3/uL (ref 4.0–10.5)

## 2023-06-02 LAB — COMPREHENSIVE METABOLIC PANEL
ALT: 12 U/L (ref 0–35)
AST: 14 U/L (ref 0–37)
Albumin: 3.9 g/dL (ref 3.5–5.2)
Alkaline Phosphatase: 37 U/L — ABNORMAL LOW (ref 39–117)
BUN: 16 mg/dL (ref 6–23)
CO2: 26 meq/L (ref 19–32)
Calcium: 8.9 mg/dL (ref 8.4–10.5)
Chloride: 107 meq/L (ref 96–112)
Creatinine, Ser: 0.66 mg/dL (ref 0.40–1.20)
GFR: 101.32 mL/min (ref >60.00)
Glucose, Bld: 88 mg/dL (ref 70–99)
Potassium: 4.3 meq/L (ref 3.5–5.1)
Sodium: 139 meq/L (ref 135–145)
Total Bilirubin: 0.5 mg/dL (ref 0.2–1.2)
Total Protein: 7 g/dL (ref 6.0–8.3)

## 2023-06-02 LAB — VITAMIN B12: Vitamin B-12: 707 pg/mL (ref 211–911)

## 2023-06-02 LAB — TSH: TSH: 2.08 u[IU]/mL (ref 0.35–5.50)

## 2023-06-02 NOTE — Progress Notes (Unsigned)
Subjective:    [name], female    DOB: Jul 25, 1971, 52 y.o.   MRN: [id number]  Patient here for  Chief Complaint  Patient presents with   Annual Exam    HPI Here for a physical exam. Sees gyn.  Last pap 05/27/22 - no abnormalities/negative HPV.  Continues on junel FE. Previously saw Dr Lonna Cobb - f/u hydronephrosis - secondary to right UPJ calculus - passed.  Last evaluated 07/2021. Recommended f/u KUB one year.  Discussed.  Having no issues currently. Will plan to f/u if any problems.  Is walking.  No chest pain or sob reported.  Son - married - two months ago. One week after, was sick.  Increased congestion.  Lost taste and smell.  Never tested for covid.  Does report noticed fatigue after being sick.  Has continued to have fatigue.  Not sleeping as well.  Some hot flashes.  More irritable at times.     Past Medical History:  Diagnosis Date   Allergy    Fainting spell    Headache(784.0)    Recurrent urinary tract infection    Past Surgical History:  Procedure Laterality Date   CESAREAN SECTION  2008   Family History  Problem Relation Age of Onset   Hyperlipidemia Mother    Diabetes Maternal Aunt    Lung cancer Maternal Uncle    Lung cancer Paternal Aunt    Uterine cancer Paternal Aunt        30s   Lung cancer Paternal Aunt    Breast cancer Neg Hx    Social History   Socioeconomic History   Marital status: Married    Spouse name: Not on file   Number of children: 2   Years of education: Not on file   Highest education level: Not on file  Occupational History    Employer: [employer].  Tobacco Use   Smoking status: Never   Smokeless tobacco: Never  Vaping Use   Vaping status: Never Used  Substance and Sexual Activity   Alcohol use: Yes    Alcohol/week: 0.0 standard drinks of alcohol   Drug use: No   Sexual activity: Yes    Birth control/protection: Pill  Other Topics Concern   Not on file  Social History Narrative   Not on file    Social Determinants of Health   Financial Resource Strain: Not on file  Food Insecurity: Not on file  Transportation Needs: Not on file  Physical Activity: Not on file  Stress: Not on file  Social Connections: Not on file     Review of Systems     Objective:     BP 112/70   Pulse 81   Temp 98.2 F (36.8 C)   Resp 16   Ht 5\' 3"  (1.6 m)   Wt 146 lb (66.2 kg)   SpO2 99%   BMI 25.86 kg/m  Wt Readings from Last 3 Encounters:  06/02/23 146 lb (66.2 kg)  09/27/22 153 lb (69.4 kg)  05/27/22 148 lb 6.4 oz (67.3 kg)    Physical Exam   Outpatient Encounter Medications as of 06/02/2023  Medication Sig   COLLAGEN PO Use   Cyanocobalamin (VITAMIN B-12 PO) Take by mouth.   JUNEL FE 1.5/30 1.5-30 MG-MCG tablet Take 1 tablet by mouth daily. CONTINUOUS DOSING   VITAMIN D PO Take by mouth.   No facility-administered encounter medications on file as of 06/02/2023.     Lab Results  Component Value  Date   WBC 6.9 05/27/2022   HGB 13.6 05/27/2022   HCT 42.3 05/27/2022   PLT 281.0 05/27/2022   GLUCOSE 77 05/27/2022   CHOL 165 05/27/2022   TRIG 89.0 05/27/2022   HDL 39.80 05/27/2022   LDLCALC 107 (H) 05/27/2022   ALT 13 05/27/2022   AST 16 05/27/2022   NA 139 05/27/2022   K 4.4 05/27/2022   CL 107 05/27/2022   CREATININE 0.64 05/27/2022   BUN 13 05/27/2022   CO2 26 05/27/2022   TSH 2.02 05/27/2022    MM 3D SCREEN BREAST BILATERAL  Result Date: 11/22/2022 CLINICAL DATA:  Screening. EXAM: DIGITAL SCREENING BILATERAL MAMMOGRAM WITH TOMOSYNTHESIS AND CAD TECHNIQUE: Bilateral screening digital craniocaudal and mediolateral oblique mammograms were obtained. Bilateral screening digital breast tomosynthesis was performed. The images were evaluated with computer-aided detection. COMPARISON:  Previous exam(s). ACR Breast Density Category d: The breasts are extremely dense, which lowers the sensitivity of mammography. FINDINGS: There are no findings suspicious for malignancy.  IMPRESSION: No mammographic evidence of malignancy. A result letter of this screening mammogram will be mailed directly to the patient. RECOMMENDATION: Screening mammogram in one year. (Code:SM-B-01Y) BI-RADS CATEGORY  1: Negative. Electronically Signed   By: Amie Portland M.D.   On: 11/22/2022 08:30       Assessment & Plan:  History of kidney stones  Health care maintenance Assessment & Plan: Physical today 06/02/23.   Last pap 05/27/22 - no abnormalities/negative HPV. Mammogram. 11/18/22- [facility] I.  Colonoscopy 12/20/21 - Dr Norma Fredrickson.  Recommended f/u in 5 years.    B12 deficiency  Screening cholesterol level     Dale [place], MD

## 2023-06-02 NOTE — Assessment & Plan Note (Signed)
Physical today 06/02/23.   Last pap 05/27/22 - no abnormalities/negative HPV. Mammogram. 11/18/22- [facility].  Colonoscopy 12/20/21 - Dr Norma Fredrickson.  Recommended f/u in 5 years.

## 2023-06-05 ENCOUNTER — Encounter: Payer: Self-pay | Admitting: Internal Medicine

## 2023-06-05 DIAGNOSIS — G479 Sleep disorder, unspecified: Secondary | ICD-10-CM | POA: Insufficient documentation

## 2023-06-05 DIAGNOSIS — Z85820 Personal history of malignant melanoma of skin: Secondary | ICD-10-CM | POA: Insufficient documentation

## 2023-06-05 NOTE — Assessment & Plan Note (Signed)
Check B12 level. 

## 2023-06-05 NOTE — Assessment & Plan Note (Signed)
Stable.  Discussed magnesium.

## 2023-06-05 NOTE — Assessment & Plan Note (Signed)
Seeing dermatology.

## 2023-06-05 NOTE — Assessment & Plan Note (Signed)
Discussed.  Probably multifactorial.  Discussed routines and behavioral modification.  Magnesium glycinate.  Check routine labs as outlined.  Follow.  Call with update.

## 2023-06-05 NOTE — Assessment & Plan Note (Signed)
Saw Dr Lonna Cobb as outlined.  Recommended f/u one year - KUB.  Discussed topamax and her history of kidney stones.  Off topamax. Wants to hold on f/u at this time. Follow.

## 2023-07-23 ENCOUNTER — Other Ambulatory Visit: Payer: Self-pay | Admitting: Obstetrics and Gynecology

## 2023-07-23 DIAGNOSIS — Z3041 Encounter for surveillance of contraceptive pills: Secondary | ICD-10-CM

## 2023-07-27 ENCOUNTER — Other Ambulatory Visit: Payer: Self-pay

## 2023-07-27 ENCOUNTER — Other Ambulatory Visit: Payer: Self-pay | Admitting: Obstetrics and Gynecology

## 2023-07-27 DIAGNOSIS — Z3041 Encounter for surveillance of contraceptive pills: Secondary | ICD-10-CM

## 2023-07-27 MED ORDER — JUNEL FE 1.5/30 1.5-30 MG-MCG PO TABS: 1.0000 | ORAL_TABLET | Freq: Every day | ORAL | 0 refills | Status: DC

## 2023-09-23 ENCOUNTER — Other Ambulatory Visit: Payer: Self-pay | Admitting: Obstetrics and Gynecology

## 2023-09-23 DIAGNOSIS — Z3041 Encounter for surveillance of contraceptive pills: Secondary | ICD-10-CM

## 2023-09-30 NOTE — Progress Notes (Unsigned)
 PCP: Dale [place], MD   No chief complaint on file.   HPI:      [name] is a 53 y.o. [phone] whose LMP was No LMP recorded. (Menstrual status: Oral contraceptives)., presents today for her annual examination.  Her menses are absent with cont dosing of OCPs for headache mgmt. Still has headaches on active pills same time each pack, but headaches are 3-4 days instead of 7 with OCPs. Not typical migraines, no aura. Stopped topamax with PCP, no HA change. Having hot flashes now.  No hx of HTN, DVTs, tob use.  Sex activity: single partner, contraception - OCP (estrogen/progesterone). She does not have vaginal dryness. No pain/bleeding.   Last Pap: 05/27/22  Results were: no abnormalities /neg HPV DNA. No hx of abn paps  Last mammogram: 11/18/22 at [facility];  Results were: normal--routine follow-up in 12 months There is no FH of breast cancer. There is no FH of ovarian cancer. The patient does do self-breast exams.  Colonoscopy: 3/23 at [facility] with polyp, Repeat after 5 yrs per pt  Tobacco use: The patient denies current or previous tobacco use. Alcohol use: social drinker No drug use Exercise: min active  She does get adequate calcium and Vitamin D in her diet.  Labs with PCP.   Patient Active Problem List   Diagnosis Date Noted   History of melanoma 06/05/2023   Sleeping difficulty 06/05/2023   Hydronephrosis 05/22/2021   History of kidney stones 05/20/2021   B12 deficiency 05/20/2021   Heavy menses 01/11/2016   Health care maintenance 01/11/2015   Pelvic pain in female 01/11/2015   Recurrent urinary tract infection 12/02/2012   Urinary tract infection 12/02/2012   Headache 11/30/2012   Chronic cystitis 09/06/2012    Past Surgical History:  Procedure Laterality Date   CESAREAN SECTION  2008    Family History  Problem Relation Age of Onset   Hyperlipidemia Mother    Diabetes Maternal Aunt    Lung cancer Maternal Uncle    Lung cancer Paternal Aunt     Uterine cancer Paternal Aunt        30s   Lung cancer Paternal Aunt    Breast cancer Neg Hx     Social History   Socioeconomic History   Marital status: Married    Spouse name: Not on file   Number of children: 2   Years of education: Not on file   Highest education level: Not on file  Occupational History    Employer: [employer].  Tobacco Use   Smoking status: Never   Smokeless tobacco: Never  Vaping Use   Vaping status: Never Used  Substance and Sexual Activity   Alcohol use: Yes    Alcohol/week: 0.0 standard drinks of alcohol   Drug use: No   Sexual activity: Yes    Birth control/protection: Pill  Other Topics Concern   Not on file  Social History Narrative   Not on file   Social Drivers of Health   Financial Resource Strain: Not on file  Food Insecurity: Not on file  Transportation Needs: Not on file  Physical Activity: Not on file  Stress: Not on file  Social Connections: Not on file  Intimate Partner Violence: Not on file     Current Outpatient Medications:    COLLAGEN PO, Use, Disp: , Rfl:    Cyanocobalamin (VITAMIN B-12 PO), Take by mouth., Disp: , Rfl:    JUNEL FE 1.5/30 1.5-30 MG-MCG tablet, Take  1 tablet by mouth daily. CONTINUOUS DOSING, Disp: 84 tablet, Rfl: 0   VITAMIN D PO, Take by mouth., Disp: , Rfl:      ROS:  Review of Systems  Constitutional:  Negative for fatigue, fever and unexpected weight change.  Respiratory:  Negative for cough, shortness of breath and wheezing.   Cardiovascular:  Negative for chest pain, palpitations and leg swelling.  Gastrointestinal:  Negative for blood in stool, constipation, diarrhea, nausea and vomiting.  Endocrine: Negative for cold intolerance, heat intolerance and polyuria.  Genitourinary:  Negative for dyspareunia, dysuria, flank pain, frequency, genital sores, hematuria, menstrual problem, pelvic pain, urgency, vaginal bleeding, vaginal discharge and vaginal pain.  Musculoskeletal:  Negative for  back pain, joint swelling and myalgias.  Skin:  Negative for rash.  Neurological:  Positive for headaches. Negative for dizziness, syncope, light-headedness and numbness.  Hematological:  Negative for adenopathy.  Psychiatric/Behavioral:  Negative for agitation, confusion, sleep disturbance and suicidal ideas. The patient is not nervous/anxious.    BREAST: No symptoms    Objective: There were no vitals taken for this visit.   Physical Exam Constitutional:      Appearance: She is well-developed.  Genitourinary:     Vulva normal.     Right Labia: No rash, tenderness or lesions.    Left Labia: No tenderness, lesions or rash.    No vaginal discharge, erythema or tenderness.      Right Adnexa: not tender and no mass present.    Left Adnexa: not tender and no mass present.    No cervical friability or polyp.     Uterus is not enlarged or tender.  Breasts:    Right: No mass, nipple discharge, skin change or tenderness.     Left: No mass, nipple discharge, skin change or tenderness.  Neck:     Thyroid: No thyromegaly.  Cardiovascular:     Rate and Rhythm: Normal rate and regular rhythm.     Heart sounds: Normal heart sounds. No murmur heard. Pulmonary:     Effort: Pulmonary effort is normal.     Breath sounds: Normal breath sounds.  Abdominal:     Palpations: Abdomen is soft.     Tenderness: There is no abdominal tenderness. There is no guarding or rebound.  Musculoskeletal:        General: Normal range of motion.     Cervical back: Normal range of motion.  Lymphadenopathy:     Cervical: No cervical adenopathy.  Neurological:     General: No focal deficit present.     Mental Status: She is alert and oriented to person, place, and time.     Cranial Nerves: No cranial nerve deficit.  Skin:    General: Skin is warm and dry.  Psychiatric:        Mood and Affect: Mood normal.        Behavior: Behavior normal.        Thought Content: Thought content normal.        Judgment:  Judgment normal.  Vitals reviewed.     Assessment/Plan:  Encounter for annual routine gynecological examination  Encounter for screening mammogram for malignant neoplasm of breast - Plan: MM 3D SCREEN BREAST BILATERAL, pt to schedule mammo  Encounter for surveillance of contraceptive pills - Plan: JUNEL FE 1.5/30 1.5-30 MG-MCG tablet; OCP RF. Will cont for now. Will re-eval next yr.    No orders of the defined types were placed in this encounter.  GYN counsel breast self exam, mammography screening, menopause, adequate intake of calcium and vitamin D, diet and exercise    F/U  No follow-ups on file.  Moani Weipert B. Latese Dufault, PA-C 09/30/2023 12:59 PM

## 2023-10-02 ENCOUNTER — Ambulatory Visit (INDEPENDENT_AMBULATORY_CARE_PROVIDER_SITE_OTHER): Payer: Managed Care, Other (non HMO) | Admitting: Obstetrics and Gynecology

## 2023-10-02 ENCOUNTER — Encounter: Payer: Self-pay | Admitting: Obstetrics and Gynecology

## 2023-10-02 VITALS — BP 118/72 | HR 81 | Ht 62.0 in | Wt 155.0 lb

## 2023-10-02 DIAGNOSIS — Z1231 Encounter for screening mammogram for malignant neoplasm of breast: Secondary | ICD-10-CM

## 2023-10-02 DIAGNOSIS — G43839 Menstrual migraine, intractable, without status migrainosus: Secondary | ICD-10-CM | POA: Insufficient documentation

## 2023-10-02 DIAGNOSIS — Z01419 Encounter for gynecological examination (general) (routine) without abnormal findings: Secondary | ICD-10-CM

## 2023-10-02 DIAGNOSIS — Z3041 Encounter for surveillance of contraceptive pills: Secondary | ICD-10-CM

## 2023-10-02 MED ORDER — NORETHIN ACE-ETH ESTRAD-FE 1-20 MG-MCG PO TABS: 1.0000 | ORAL_TABLET | Freq: Every day | ORAL | 4 refills | Status: AC

## 2023-10-02 NOTE — Patient Instructions (Signed)
 I value your feedback and you entrusting [place] with your care. If you get a [facility] patient survey, I would appreciate you taking the time to let us know about your experience today. Thank you! ? ? ?

## 2023-11-24 ENCOUNTER — Ambulatory Visit

## 2023-12-01 ENCOUNTER — Ambulatory Visit
Admission: RE | Admit: 2023-12-01 | Discharge: 2023-12-01 | Disposition: A | Discharge status: Home / Self Care | Source: Ambulatory Visit | Attending: Obstetrics and Gynecology | Admitting: Obstetrics and Gynecology

## 2023-12-01 DIAGNOSIS — Z1231 Encounter for screening mammogram for malignant neoplasm of breast: Secondary | ICD-10-CM

## 2023-12-05 ENCOUNTER — Encounter: Payer: Self-pay | Admitting: Obstetrics and Gynecology

## 2024-01-28 IMAGING — MG MM DIGITAL SCREENING BILAT W/ TOMO AND CAD
8 series · 9 of 24 positions shown · non-contrast
Comparison: Previous exam(s).

CLINICAL DATA: Screening.

EXAM:
DIGITAL SCREENING BILATERAL MAMMOGRAM WITH TOMOSYNTHESIS AND CAD
TECHNIQUE: Bilateral screening digital craniocaudal and mediolateral oblique
mammograms were obtained. Bilateral screening digital breast
tomosynthesis was performed. The images were evaluated with
computer-aided detection.

[R CC synth-2D]
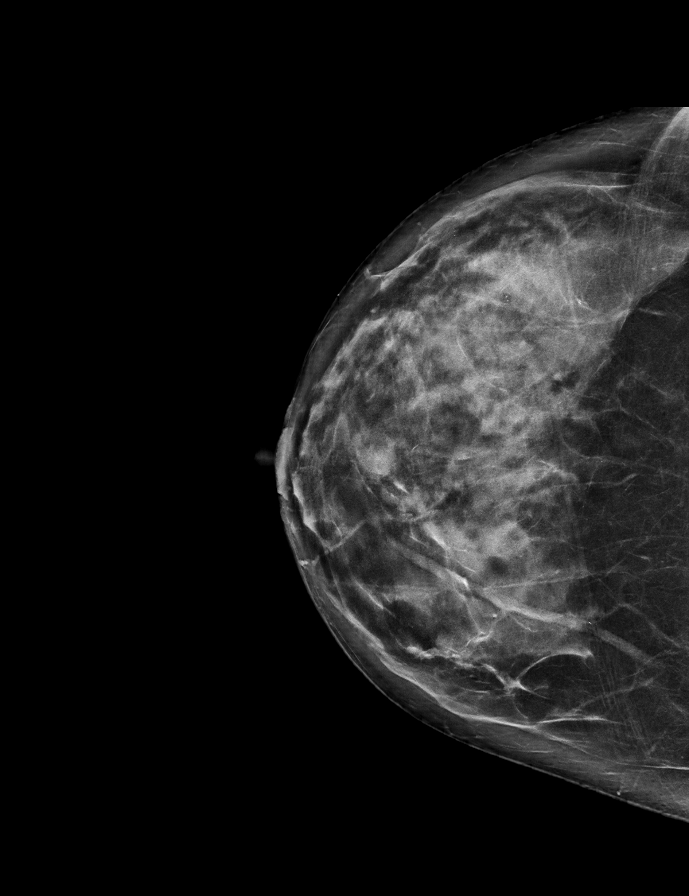

[R MLO synth-2D]
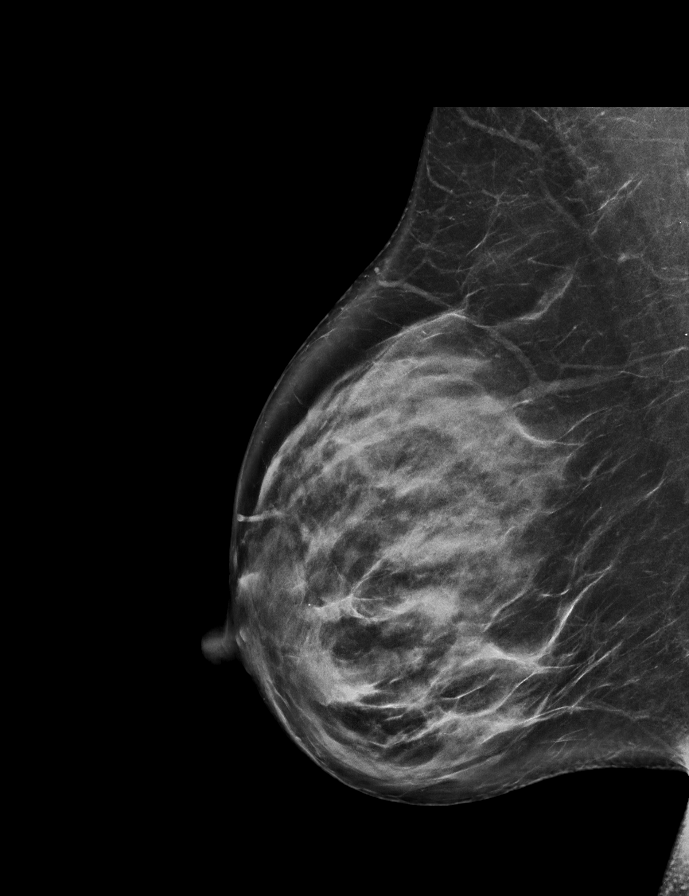

[L CC synth-2D]
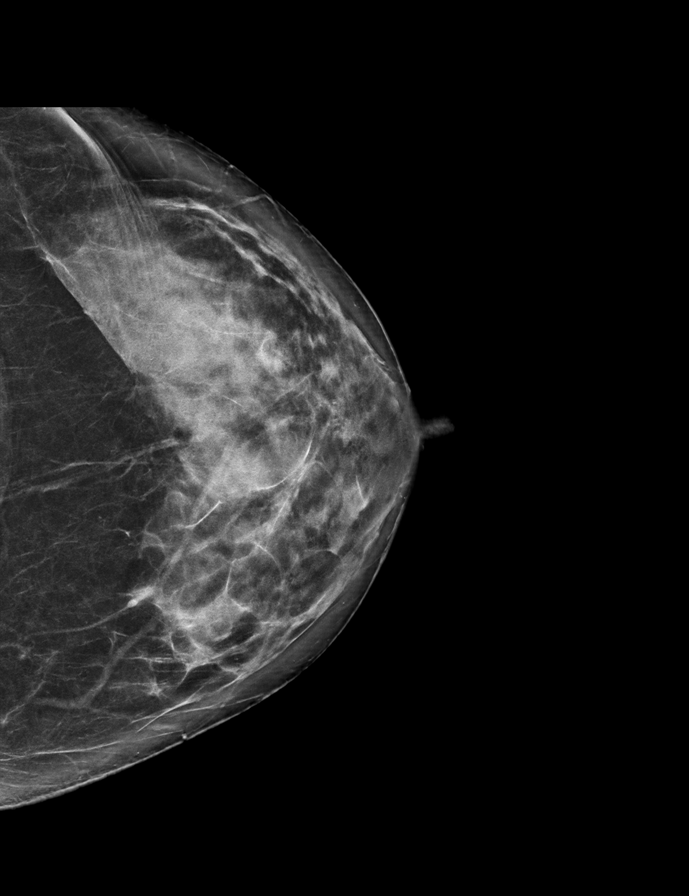

[L MLO synth-2D]
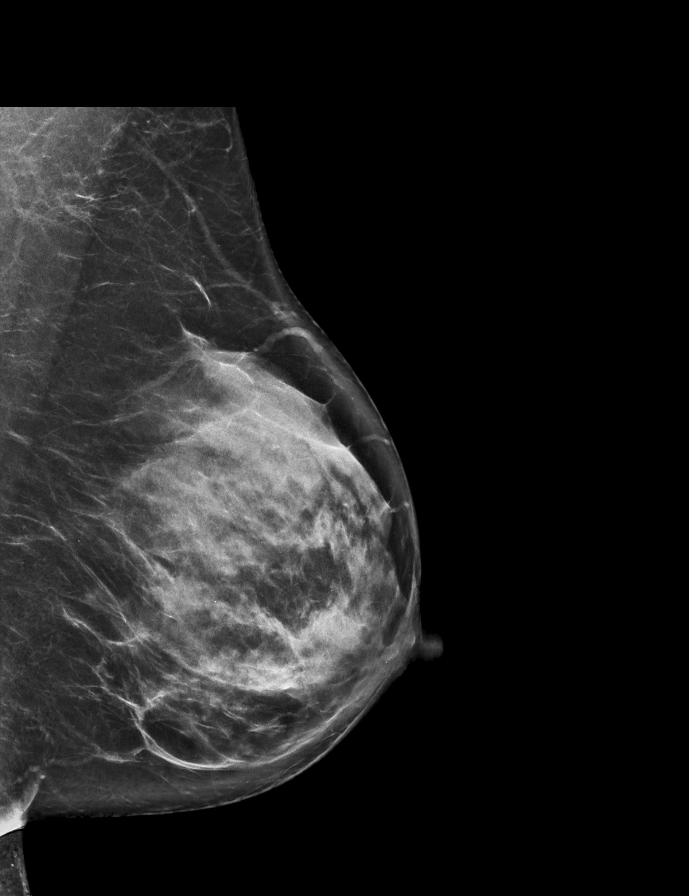

[R CC tomo · 2 of 76 frames shown]
[frame 25/76]
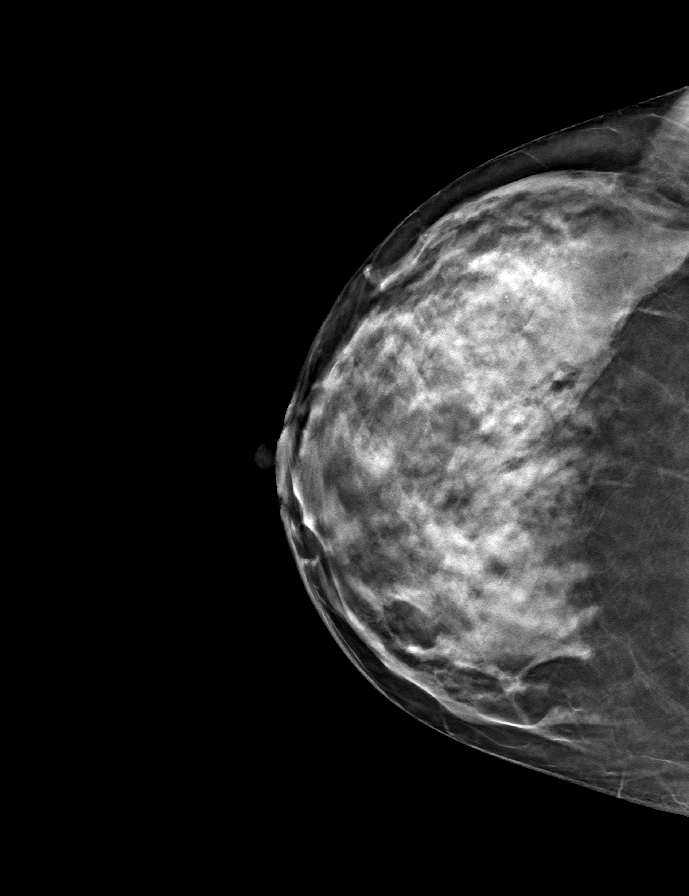
[frame 39/76]
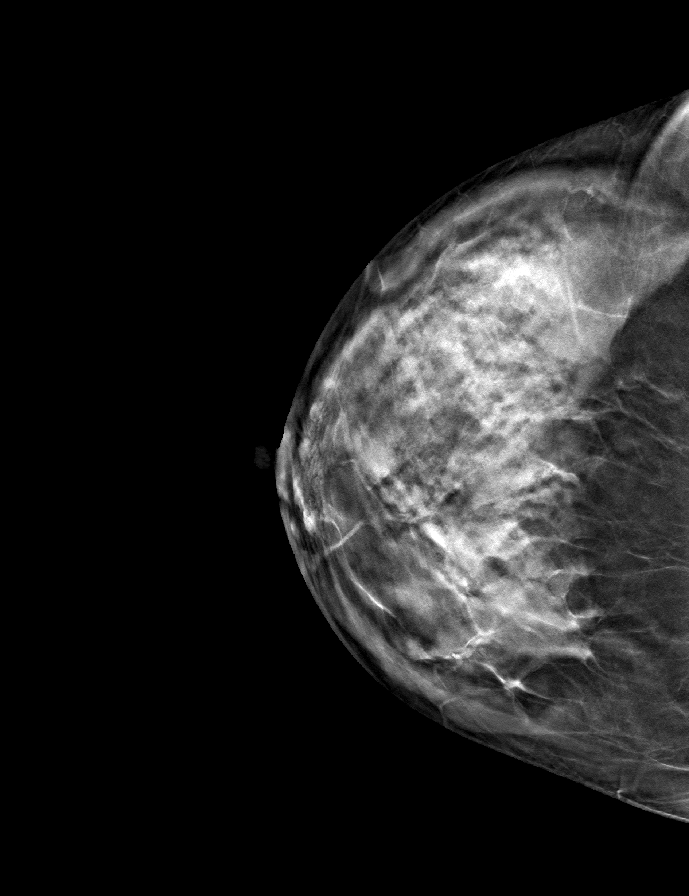

[L CC tomo · tomo slice 37/73.0]
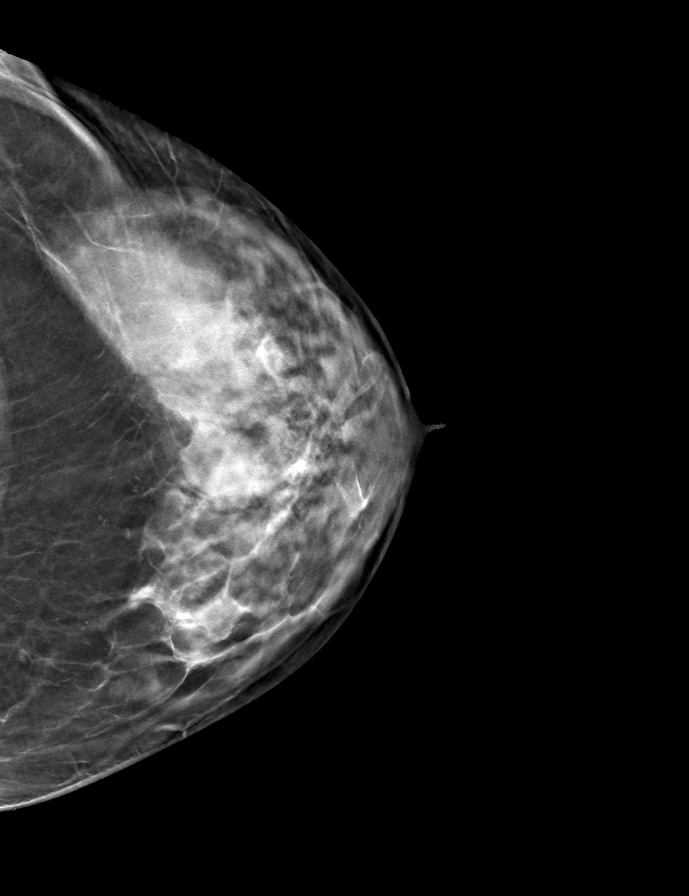

[L MLO tomo · tomo slice 37/73.0]
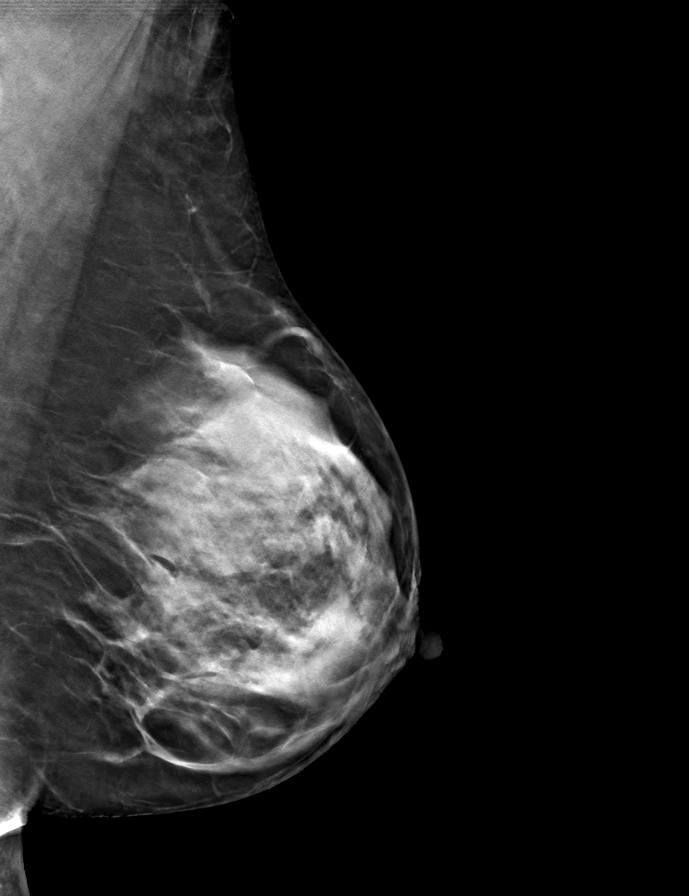

[R MLO tomo · tomo slice 37/72.0]
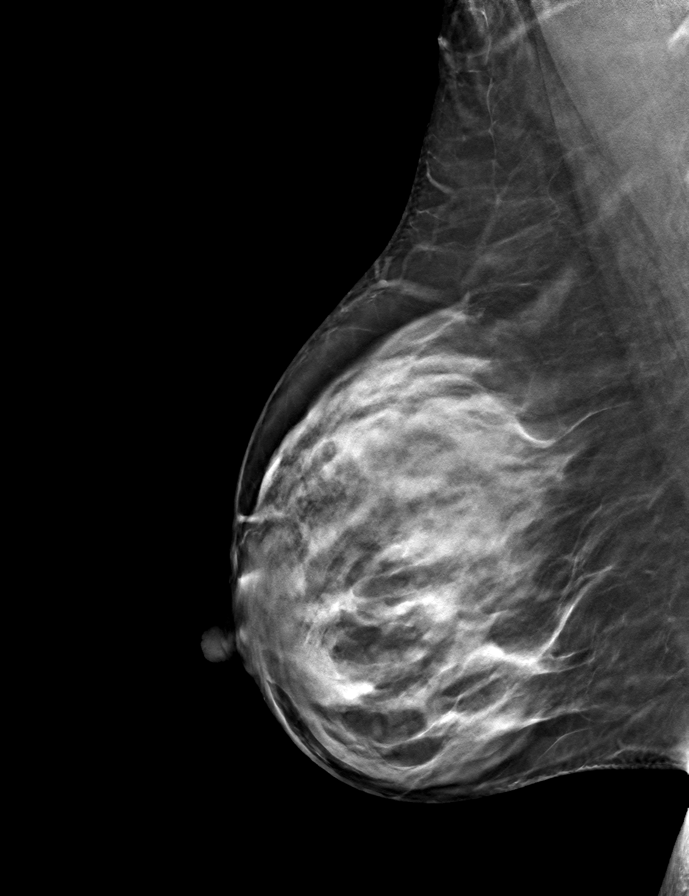

[9 of 24 positions shown; findings below may reference images not displayed]

ACR Breast Density Category d: The breast tissue is extremely dense,
which lowers the sensitivity of mammography
FINDINGS: There are no findings suspicious for malignancy.
IMPRESSION: No mammographic evidence of malignancy. A result letter of this
screening mammogram will be mailed directly to the patient.

RECOMMENDATION:
Screening mammogram in one year. (Code:TA-V-WV9)

BI-RADS CATEGORY  1: Negative.

## 2024-04-25 ENCOUNTER — Encounter: Payer: Self-pay | Admitting: Family Medicine

## 2024-04-25 ENCOUNTER — Ambulatory Visit: Admitting: Family Medicine

## 2024-04-25 ENCOUNTER — Encounter: Payer: Self-pay | Admitting: Internal Medicine

## 2024-04-25 VITALS — BP 126/70 | HR 94 | Temp 98.5°F | Ht 62.0 in | Wt 162.8 lb

## 2024-04-25 DIAGNOSIS — L03116 Cellulitis of left lower limb: Secondary | ICD-10-CM | POA: Diagnosis not present

## 2024-04-25 MED ORDER — AMOXICILLIN-POT CLAVULANATE 875-125 MG PO TABS: 1.0000 | ORAL_TABLET | Freq: Two times a day (BID) | ORAL | 0 refills | Status: DC

## 2024-04-25 NOTE — Progress Notes (Signed)
 She felt something on the L knee yesterday, unclear what caused it but she thought there was a bite locally, ie from an insect.  Then progressive redness today. It feels hot.  No fevers.  She doesn't feel unwell o/w.   Meds, vitals, and allergies reviewed.   ROS: Per HPI unless specifically indicated in ROS section   Nad Ncat Rrr 19 x 17 cm patch of reddish skin on the medial and anterior L knee.   Border marked.  No fluctuant mass. She has 2 likely bite sites on the left leg near the knee. She has some distal edema on the left anterior shin but not on the right.

## 2024-04-25 NOTE — Patient Instructions (Signed)
 Start augmentin  and keep your leg elevated.   If any fever or progressive redness or more pain, then go to the ER.  Take care.  Glad to see you.

## 2024-04-26 ENCOUNTER — Telehealth: Payer: Self-pay | Admitting: *Deleted

## 2024-04-26 NOTE — Telephone Encounter (Signed)
 Noted. Thanks.  Mychart message sent to patient.

## 2024-04-26 NOTE — Telephone Encounter (Signed)
 Copied from CRM [id number]. Topic: General - Other >> Apr 26, 2024  3:19 PM Cassandra Matthews wrote: Reason for CRM: PCP asked for callback today to let him know how her leg is doing with the swelling. She wants him to know it is doing much better today! Swelling is much better!

## 2024-04-28 DIAGNOSIS — L0390 Cellulitis, unspecified: Secondary | ICD-10-CM | POA: Insufficient documentation

## 2024-04-28 NOTE — Assessment & Plan Note (Signed)
 Likely infectious cellulitis due to skin disruption, not an allergic reaction.  No lip or tongue swelling.  Okay for outpatient follow-up.  Discussed options. Start augmentin  and keep leg elevated.   If any fever or progressive redness or more pain, then go to the ER.  Update us  in the near future.  She agrees to plan.

## 2024-06-07 ENCOUNTER — Encounter: Payer: Self-pay | Admitting: Internal Medicine

## 2024-06-07 ENCOUNTER — Ambulatory Visit: Admitting: Internal Medicine

## 2024-06-07 VITALS — BP 130/80 | HR 86 | Temp 98.2°F | Ht 62.5 in | Wt 163.0 lb

## 2024-06-07 DIAGNOSIS — Z13220 Encounter for screening for lipoid disorders: Secondary | ICD-10-CM

## 2024-06-07 DIAGNOSIS — Z0000 Encounter for general adult medical examination without abnormal findings: Secondary | ICD-10-CM | POA: Diagnosis not present

## 2024-06-07 DIAGNOSIS — Z87442 Personal history of urinary calculi: Secondary | ICD-10-CM | POA: Diagnosis not present

## 2024-06-07 DIAGNOSIS — Z23 Encounter for immunization: Secondary | ICD-10-CM

## 2024-06-07 DIAGNOSIS — R519 Headache, unspecified: Secondary | ICD-10-CM | POA: Diagnosis not present

## 2024-06-07 NOTE — Assessment & Plan Note (Signed)
 Physical today 06/07/24.   Last pap 05/27/22 - no abnormalities/negative HPV. Mammogram. 12/05/23- [facility].  Colonoscopy 12/20/21 - Dr Aundria.  Recommended f/u in 5 years.

## 2024-06-07 NOTE — Progress Notes (Signed)
 Subjective:    Patient ID: Katheryn LOISE Grist, female    DOB: 06-13-1971, 53 y.o.   MRN: [id number]  Patient here for  Chief Complaint  Patient presents with   Annual Exam    CPE    HPI Here for a physical exam. Recently evaluated [facility]. Concern regarding possible cellulits - left knee. Treated with augmentin . Overall doing relatively well. Concern regarding weight gain. Reviewed weight. Discussed diet and exercise. Breathing stable. No abdominal pain or bowel change.    Past Medical History:  Diagnosis Date   Allergy    Fainting spell    Headache(784.0)    Recurrent urinary tract infection    Past Surgical History:  Procedure Laterality Date   CESAREAN SECTION  2008   Family History  Problem Relation Age of Onset   Hyperlipidemia Mother    Diabetes Maternal Aunt    Lung cancer Maternal Uncle    Lung cancer Paternal Aunt    Uterine cancer Paternal Aunt        30s   Lung cancer Paternal Aunt    Breast cancer Neg Hx    Social History   Socioeconomic History   Marital status: Married    Spouse name: Not on file   Number of children: 2   Years of education: Not on file   Highest education level: Not on file  Occupational History    Employer: [employer].  Tobacco Use   Smoking status: Never   Smokeless tobacco: Never  Vaping Use   Vaping status: Never Used  Substance and Sexual Activity   Alcohol use: Yes    Alcohol/week: 0.0 standard drinks of alcohol   Drug use: No   Sexual activity: Yes    Birth control/protection: Pill  Other Topics Concern   Not on file  Social History Narrative   Not on file   Social Drivers of Health   Financial Resource Strain: Not on file  Food Insecurity: Not on file  Transportation Needs: Not on file  Physical Activity: Not on file  Stress: Not on file  Social Connections: Not on file     Review of Systems  Constitutional:  Negative for appetite change and unexpected weight change.  HENT:  Negative for  congestion, sinus pressure and sore throat.   Eyes:  Negative for pain and visual disturbance.  Respiratory:  Negative for cough, chest tightness and shortness of breath.   Cardiovascular:  Negative for chest pain and palpitations.  Gastrointestinal:  Negative for abdominal pain, diarrhea, nausea and vomiting.  Genitourinary:  Negative for difficulty urinating and dysuria.  Musculoskeletal:  Negative for joint swelling and myalgias.  Skin:  Negative for color change and rash.  Neurological:  Negative for dizziness and headaches.  Hematological:  Negative for adenopathy. Does not bruise/bleed easily.  Psychiatric/Behavioral:  Negative for agitation and dysphoric mood.        Objective:     BP 130/80   Pulse 86   Temp 98.2 F (36.8 C) (Oral)   Ht 5' 2.5 (1.588 m)   Wt 163 lb (73.9 kg)   SpO2 97%   BMI 29.34 kg/m  Wt Readings from Last 3 Encounters:  06/07/24 163 lb (73.9 kg)  04/25/24 162 lb 12.8 oz (73.8 kg)  10/02/23 155 lb (70.3 kg)    Physical Exam Vitals reviewed.  Constitutional:      General: She is not in acute distress.    Appearance: Normal appearance. She is well-developed.  HENT:  Head: Normocephalic and atraumatic.     Right Ear: External ear normal.     Left Ear: External ear normal.     Mouth/Throat:     Pharynx: No oropharyngeal exudate or posterior oropharyngeal erythema.  Eyes:     General: No scleral icterus.       Right eye: No discharge.        Left eye: No discharge.     Conjunctiva/sclera: Conjunctivae normal.  Neck:     Thyroid : No thyromegaly.  Cardiovascular:     Rate and Rhythm: Normal rate and regular rhythm.  Pulmonary:     Effort: No tachypnea, accessory muscle usage or respiratory distress.     Breath sounds: No decreased breath sounds, wheezing or rhonchi.  Chest:  Breasts:    Right: No inverted nipple, mass, nipple discharge or tenderness (no axillary adenopathy).     Left: No inverted nipple, mass, nipple discharge or  tenderness (no axilarry adenopathy).  Abdominal:     General: Bowel sounds are normal.     Palpations: Abdomen is soft.     Tenderness: There is no abdominal tenderness.  Musculoskeletal:        General: No swelling or tenderness.     Cervical back: Neck supple.  Lymphadenopathy:     Cervical: No cervical adenopathy.  Skin:    Findings: No erythema or rash.  Neurological:     Mental Status: She is alert and oriented to person, place, and time.  Psychiatric:        Mood and Affect: Mood normal.        Behavior: Behavior normal.         Outpatient Encounter Medications as of 06/07/2024  Medication Sig   COLLAGEN PO Use   Cyanocobalamin  (VITAMIN B-12 PO) Take by mouth.   norethindrone-ethinyl estradiol -FE (JUNEL  FE 1/20) 1-20 MG-MCG tablet Take 1 tablet by mouth daily. CONTINUOUS DOSING   VITAMIN D PO Take by mouth.   [DISCONTINUED] amoxicillin -clavulanate (AUGMENTIN ) 875-125 MG tablet Take 1 tablet by mouth 2 (two) times daily.   No facility-administered encounter medications on file as of 06/07/2024.     Lab Results  Component Value Date   WBC 9.1 06/07/2024   HGB 12.9 06/07/2024   HCT 40.4 06/07/2024   PLT 316 06/07/2024   GLUCOSE 58 (L) 06/07/2024   CHOL 155 06/07/2024   TRIG 76 06/07/2024   HDL 49 (L) 06/07/2024   LDLCALC 89 06/07/2024   ALT 13 06/07/2024   AST 14 06/07/2024   NA 142 06/07/2024   K 3.8 06/07/2024   CL 109 06/07/2024   CREATININE 0.57 06/07/2024   BUN 14 06/07/2024   CO2 27 06/07/2024   TSH 1.81 06/07/2024    MM 3D SCREENING MAMMOGRAM BILATERAL BREAST Result Date: 12/05/2023 CLINICAL DATA:  Screening. EXAM: DIGITAL SCREENING BILATERAL MAMMOGRAM WITH TOMOSYNTHESIS AND CAD TECHNIQUE: Bilateral screening digital craniocaudal and mediolateral oblique mammograms were obtained. Bilateral screening digital breast tomosynthesis was performed. The images were evaluated with computer-aided detection. COMPARISON:  Previous exam(s). ACR Breast Density  Category d: The breasts are extremely dense, which lowers the sensitivity of mammography. FINDINGS: There are no findings suspicious for malignancy. IMPRESSION: No mammographic evidence of malignancy. A result letter of this screening mammogram will be mailed directly to the patient. RECOMMENDATION: Screening mammogram in one year. (Code:SM-B-01Y) BI-RADS CATEGORY  1: Negative. Electronically Signed   By: Toribio Agreste M.D.   On: 12/05/2023 12:59       Assessment & Plan:  Health  care maintenance Assessment & Plan: Physical today 06/07/24.   Last pap 05/27/22 - no abnormalities/negative HPV. Mammogram. 12/05/23- Briads I.  Colonoscopy 12/20/21 - Dr Aundria.  Recommended f/u in 5 years.    Screening cholesterol level -     Lipid panel  History of kidney stones Assessment & Plan: Saw Dr Twylla previously.  Recommended f/u one year - KUB.  Was previously on topamax . Off now. Had previously wanted to hold on f/u. Currently stable.   Orders: -     TSH -     Comprehensive metabolic panel with GFR -     CBC with Differential/Platelet  Flu vaccine need -     Flu vaccine trivalent PF, 6mos and older(Flulaval,Afluria,Fluarix,Fluzone)  Nonintractable headache, unspecified chronicity pattern, unspecified headache type Assessment & Plan: Stable.       Allena Hamilton, MD

## 2024-06-08 LAB — CBC WITH DIFFERENTIAL/PLATELET
Absolute Lymphocytes: 3604 {cells}/uL (ref 850–3900)
Absolute Monocytes: 919 {cells}/uL (ref 200–950)
Basophils Absolute: 73 {cells}/uL (ref 0–200)
Basophils Relative: 0.8 %
Eosinophils Absolute: 237 {cells}/uL (ref 15–500)
Eosinophils Relative: 2.6 %
HCT: 40.4 % (ref 35.0–45.0)
Hemoglobin: 12.9 g/dL (ref 11.7–15.5)
MCH: 26.7 pg — ABNORMAL LOW (ref 27.0–33.0)
MCHC: 31.9 g/dL — ABNORMAL LOW (ref 32.0–36.0)
MCV: 83.6 fL (ref 80.0–100.0)
MPV: 11 fL (ref 7.5–12.5)
Monocytes Relative: 10.1 %
Neutro Abs: 4268 {cells}/uL (ref 1500–7800)
Neutrophils Relative %: 46.9 %
Platelets: 316 Thousand/uL (ref 140–400)
RBC: 4.83 Million/uL (ref 3.80–5.10)
RDW: 12.5 % (ref 11.0–15.0)
Total Lymphocyte: 39.6 %
WBC: 9.1 Thousand/uL (ref 3.8–10.8)

## 2024-06-08 LAB — LIPID PANEL
Cholesterol: 155 mg/dL (ref <200)
HDL: 49 mg/dL — ABNORMAL LOW (ref >=50)
LDL Cholesterol (Calc): 89 mg/dL
Non-HDL Cholesterol (Calc): 106 mg/dL (ref <130)
Total CHOL/HDL Ratio: 3.2 (calc) (ref <5.0)
Triglycerides: 76 mg/dL (ref <150)

## 2024-06-08 LAB — COMPREHENSIVE METABOLIC PANEL WITH GFR
AG Ratio: 1.5 (calc) (ref 1.0–2.5)
ALT: 13 U/L (ref 6–29)
AST: 14 U/L (ref 10–35)
Albumin: 3.9 g/dL (ref 3.6–5.1)
Alkaline phosphatase (APISO): 37 U/L (ref 37–153)
BUN: 14 mg/dL (ref 7–25)
CO2: 27 mmol/L (ref 20–32)
Calcium: 8.9 mg/dL (ref 8.6–10.4)
Chloride: 109 mmol/L (ref 98–110)
Creat: 0.57 mg/dL (ref 0.50–1.03)
Globulin: 2.6 g/dL (ref 1.9–3.7)
Glucose, Bld: 58 mg/dL — ABNORMAL LOW (ref 65–99)
Potassium: 3.8 mmol/L (ref 3.5–5.3)
Sodium: 142 mmol/L (ref 135–146)
Total Bilirubin: 0.2 mg/dL (ref 0.2–1.2)
Total Protein: 6.5 g/dL (ref 6.1–8.1)
eGFR: 109 mL/min/1.73m2 (ref >=60)

## 2024-06-08 LAB — TSH: TSH: 1.81 m[IU]/L

## 2024-06-09 ENCOUNTER — Ambulatory Visit: Payer: Self-pay | Admitting: Internal Medicine

## 2024-06-17 ENCOUNTER — Encounter: Payer: Self-pay | Admitting: Internal Medicine

## 2024-06-17 NOTE — Assessment & Plan Note (Signed)
 Saw Dr Twylla previously.  Recommended f/u one year - KUB.  Was previously on topamax . Off now. Had previously wanted to hold on f/u. Currently stable.

## 2024-06-17 NOTE — Assessment & Plan Note (Signed)
 Stable

## 2024-07-22 ENCOUNTER — Other Ambulatory Visit: Payer: Self-pay

## 2024-07-22 DIAGNOSIS — Z3041 Encounter for surveillance of contraceptive pills: Secondary | ICD-10-CM

## 2024-07-22 DIAGNOSIS — G43839 Menstrual migraine, intractable, without status migrainosus: Secondary | ICD-10-CM

## 2024-07-22 MED ORDER — JUNEL FE 1/20 1-20 MG-MCG PO TABS: 1.0000 | ORAL_TABLET | Freq: Every day | ORAL | 0 refills | Status: AC

## 2024-07-22 NOTE — Progress Notes (Signed)
 Annual exam due in March.  3 month supply of OCPs authorized.
